# Patient Record
Sex: Female | Born: 1960 | Race: White | Hispanic: No | Marital: Married | Smoking: Current every day smoker
Health system: Southern US, Community
[De-identification: ages and names within clinical notes are randomized; demographics above are authoritative.]

## PROBLEM LIST (undated history)

## (undated) DIAGNOSIS — M1990 Unspecified osteoarthritis, unspecified site: Secondary | ICD-10-CM

## (undated) DIAGNOSIS — F1010 Alcohol abuse, uncomplicated: Secondary | ICD-10-CM

## (undated) DIAGNOSIS — F1520 Other stimulant dependence, uncomplicated: Secondary | ICD-10-CM

## (undated) DIAGNOSIS — F419 Anxiety disorder, unspecified: Secondary | ICD-10-CM

## (undated) DIAGNOSIS — J449 Chronic obstructive pulmonary disease, unspecified: Secondary | ICD-10-CM

## (undated) DIAGNOSIS — I719 Aortic aneurysm of unspecified site, without rupture: Secondary | ICD-10-CM

## (undated) DIAGNOSIS — K859 Acute pancreatitis without necrosis or infection, unspecified: Secondary | ICD-10-CM

## (undated) HISTORY — DX: Other stimulant dependence, uncomplicated: F15.20

## (undated) HISTORY — DX: Alcohol abuse, uncomplicated: F10.10

## (undated) HISTORY — PX: CHOLECYSTECTOMY: SHX55

## (undated) HISTORY — DX: Acute pancreatitis without necrosis or infection, unspecified: K85.90

## (undated) HISTORY — PX: TUBAL LIGATION: SHX77

## (undated) HISTORY — DX: Anxiety disorder, unspecified: F41.9

## (undated) HISTORY — DX: Chronic obstructive pulmonary disease, unspecified: J44.9

## (undated) HISTORY — DX: Unspecified osteoarthritis, unspecified site: M19.90

## (undated) HISTORY — PX: ERCP: SHX60

---

## 2000-10-27 ENCOUNTER — Inpatient Hospital Stay (HOSPITAL_COMMUNITY): Admission: EM | Admit: 2000-10-27 | Discharge: 2000-10-31 | Payer: Self-pay | Admitting: Psychiatry

## 2000-11-26 ENCOUNTER — Inpatient Hospital Stay (HOSPITAL_COMMUNITY): Admission: EM | Admit: 2000-11-26 | Discharge: 2000-12-04 | Payer: Self-pay | Admitting: Psychiatry

## 2005-08-16 ENCOUNTER — Inpatient Hospital Stay (HOSPITAL_COMMUNITY): Admission: AD | Admit: 2005-08-16 | Discharge: 2005-08-18 | Payer: Self-pay | Admitting: Psychiatry

## 2005-08-16 ENCOUNTER — Ambulatory Visit: Payer: Self-pay | Admitting: *Deleted

## 2006-06-17 ENCOUNTER — Ambulatory Visit (HOSPITAL_COMMUNITY): Admission: RE | Admit: 2006-06-17 | Discharge: 2006-06-17 | Payer: Self-pay | Admitting: Family Medicine

## 2009-11-26 ENCOUNTER — Emergency Department (HOSPITAL_COMMUNITY): Admission: EM | Admit: 2009-11-26 | Discharge: 2009-11-26 | Payer: Self-pay | Admitting: Emergency Medicine

## 2009-12-21 ENCOUNTER — Emergency Department (HOSPITAL_COMMUNITY)
Admission: EM | Admit: 2009-12-21 | Discharge: 2009-12-22 | Payer: Self-pay | Source: Home / Self Care | Admitting: Emergency Medicine

## 2009-12-22 ENCOUNTER — Inpatient Hospital Stay (HOSPITAL_COMMUNITY): Admission: EM | Admit: 2009-12-22 | Discharge: 2010-01-09 | Payer: Self-pay | Source: Home / Self Care

## 2010-01-09 ENCOUNTER — Emergency Department (HOSPITAL_COMMUNITY)
Admission: EM | Admit: 2010-01-09 | Discharge: 2010-01-09 | Payer: Self-pay | Source: Home / Self Care | Admitting: Emergency Medicine

## 2010-01-09 ENCOUNTER — Ambulatory Visit: Admit: 2010-01-09 | Payer: Self-pay | Admitting: Gastroenterology

## 2010-01-09 ENCOUNTER — Encounter (INDEPENDENT_AMBULATORY_CARE_PROVIDER_SITE_OTHER): Payer: Self-pay | Admitting: *Deleted

## 2010-01-13 DIAGNOSIS — K859 Acute pancreatitis without necrosis or infection, unspecified: Secondary | ICD-10-CM

## 2010-01-13 HISTORY — DX: Acute pancreatitis without necrosis or infection, unspecified: K85.90

## 2010-01-16 ENCOUNTER — Telehealth (INDEPENDENT_AMBULATORY_CARE_PROVIDER_SITE_OTHER): Payer: Self-pay

## 2010-01-30 ENCOUNTER — Ambulatory Visit
Admission: RE | Admit: 2010-01-30 | Discharge: 2010-01-30 | Payer: Self-pay | Source: Home / Self Care | Attending: Gastroenterology | Admitting: Gastroenterology

## 2010-01-30 DIAGNOSIS — Z8719 Personal history of other diseases of the digestive system: Secondary | ICD-10-CM | POA: Insufficient documentation

## 2010-01-30 DIAGNOSIS — F1021 Alcohol dependence, in remission: Secondary | ICD-10-CM | POA: Insufficient documentation

## 2010-02-04 ENCOUNTER — Telehealth (INDEPENDENT_AMBULATORY_CARE_PROVIDER_SITE_OTHER): Payer: Self-pay

## 2010-02-14 NOTE — Miscellaneous (Signed)
Summary: CONSULTATION  [name], [name]                  ACCOUNT NO.:  [id number]      MEDICAL RECORD NO.:  [id number]          PATIENT TYPE:  INP      LOCATION:  A306                          FACILITY:  [facility]      PHYSICIAN:  R. Roetta Sessions, M.D. DATE OF BIRTH:  07-20-1960      DATE OF CONSULTATION:  12/27/2009   DATE OF DISCHARGE:                                    CONSULTATION         REASON FOR CONSULTATION:  Pancreatitis.      GASTROENTEROLOGIST:  Dr. Jena Gauss.      HISTORY OF PRESENT ILLNESS:  [name] is pleasant 50 year old   Caucasian female who reports a history of back pain times one month.   Prior to admission, she noticed exacerbation of back pain as well as   epigastric pain.  For the few days leading up to the admission, she   reports that it was stabbing, 10/10.  It radiated straight through to   her back.  It was associated with nausea and vomiting.  She does have a   history of daily alcohol use since her father passed away in 2022/03/16.   She drinks several glasses a day, usually vodka mixed with some sort of   juice.  She also does have a remote history of benzodiazepine,   methamphetamine and alcohol abuse, however, she denies any use of these   medications currently.  She has had no diarrhea.  She has had no   hematemesis.  Currently, her pain is controlled with pain medication.   She does still complain of intermittent epigastric pain that radiates to   her back as well as radiates to the right upper quadrant.  She does have   chronic underlying nausea.  She denies any history of reflux.  She did   note one episode of bright red blood per rectum four months ago.  She   reports a possible history of hemorrhoids.  She did have a CT scan done   on admission, December 22, 2009, which showed extensive inflammatory   process in the upper abdomen centered at the pancreatic head and   duodenum.  There was infiltration of the surrounding tissue planes,   fluid  in the right anterior pararenal space extending to the small bowel   mesentery and transverse mesocolon.  It was favoring pancreatitis due to   ulcer disease but recommended correlation with serum amylase level. Upon   presentation to the hospital, her lipase was 964 and white count was   13.4.      PAST MEDICAL HISTORY:  Hemorrhoids, anxiety, alcohol abuse, history of   problems with benzodiazepines, methamphetamines.      PAST SURGICAL HISTORY:  Cholecystectomy by Dr. Malvin Johns in the late   1990s as well as a reported ERCP with Dr. Jena Gauss for a common bile duct   stone.  She had a tubal ligation in 1993.  She has had no prior   colonoscopy.      SOCIAL HISTORY:  She smokes one-and-one-half packs per day for 20 years.   She denies the use of any illicit drugs.  She does drink alcohol as   mentioned previously, several glasses per day.  Her choice is vodka with   juice.  She reports this since 2022-03-21 when her father passed away.   She is unemployed.  She takes care of her mom full-time.  At home she   does have three children; two of them live at home, one lives nearby.   She is married, but she is currently separated.      FAMILY HISTORY:  Her dad passed away in 03/21/22 of lung/bladder cancer.   Her mom is living with health problems and chronic arthritis, chronic   kidney disease, COPD and peripheral vascular disease.  Her sister was   diagnosed with breast cancer.  She has no family history of colon cancer   or liver problems.      ALLERGIES:  Darvocet, codeine and lisinopril.      CURRENT MEDICATIONS FOR THIS ADMISSION:  Albuterol, Unasyn, Cipro,   clonidine, Atrovent, Ativan, magnesium sulfate, nicotine patch,   Protonix, Benadryl, Dilaudid, Phenergan, Desyrel.      REVIEW OF SYSTEMS:  Review of systems is negative in detail except as   mentioned in the HPI.      PHYSICAL EXAMINATION:  VITAL SIGNS:  BP 105/70, pulse 80, respirations   18, temp 98.2.  She is 95% on 2  liters via nasal cannula.   GENERAL:  She is alert and oriented.  She is somewhat anxious and   concerned about current situation.  Her husband was also diagnosed with   colon cancer and ended up having to have a colectomy.   HEENT:  Normocephalic and atraumatic.  Sclerae without any icterus.   NECK:  Supple without any lymphadenopathy.   LUNGS:  Clear to auscultation bilaterally, no wheezes, rales or rhonchi.   CARDIAC:  Regular rate and rhythm, S1 and S2 noted, no murmurs, rubs or   gallops.   ABDOMEN:  Soft, positive bowel sounds, moderate epigastric tenderness,   no guarding.   EXTREMITIES:  Without edema.   SKIN:  Without jaundice or rash.   NEUROLOGIC:  She is alert and oriented, calm and cooperative, however,   as mentioned before, she is quite anxious.      PERTINENT LABS FOR THIS ADMISSION:  On December 22, 2009, admitting   lipase was 964.  It trended down to 37 in the normal range on December 24, 2009.  She then had another bump in her lipase this morning to 150.   Amylase is elevated at 240.  Sodium 136, potassium 3.2, BUN 4,   creatinine 0.47, total bilirubin 1.2, alk phos 69, AST 21 and ALT 25.   It should be noted on admission she did have slightly elevated LFTs with   a total bilirubin at 1.6, AST at 231 and ALT at 118.  CBC revealed white   count 11 and on admission was 13, H and H is stable at 13.6 and 37.5,   platelets 129,000.      RADIOLOGICAL DATA FOR THIS ADMISSION:  CT of abdomen and pelvis   mentioned in detail in the HPI favoring pancreatitis over ulcer disease,   diffuse fatty infiltration of the liver, probable left ovarian cyst and   tiny umbilical hernia containing fat.      ASSESSMENT:  [name] is a 50 year old Caucasian female who   presented  on December 22, 2009 with an acute episode of pancreatitis.   This is her first episode reportedly.  It is likely thought to be   related to alcohol use as, of note, she does have history of a common    bile duct stone requiring ERCP in the 1990s.  She is scheduled to have a   repeat CT scan today due to recent labs showing a bump in her lipase.      PLAN:   1. Continue IV resuscitation, remain NPO, pain control.  Obtain       records from ERCP in the late 1990s.  Review CT scan when it is       available.   2. Will follow blood work including lipase as well as LFTs.  Continue       PPI.  She will need an outpatient colonoscopy due to the evidence       of hematochezia a few months ago.  This is not an urgent procedure       but can       be done outpatient.  We will review the findings of the CT with Dr.       Jena Gauss after it is completed to decide further intervention and       appropriate nutrition support as indicated.      We would like to thank you for this referral of this nice lady.            ______________________________   Gerrit Halls, ANP-BC         ______________________________   R. Roetta Sessions, M.D.            AS/MEDQ  D:  12/27/2009  T:  12/27/2009  Job:  [id number]      Electronically Signed by Gerrit Halls  on 01/03/2010 03:58:50 PM   Electronically Signed by Lorrin Goodell M.D. on 01/05/2010 02:59:06 PM  Clinical Lists Changes NAME:  GENEVIENE, TESCH                  ACCOUNT NO.:  [id number]      MEDICAL RECORD NO.:  [id number]          PATIENT TYPE:  INP      LOCATION:  A306                          FACILITY:  [facility]      PHYSICIAN:  R. Roetta Sessions, M.D. DATE OF BIRTH:  [birth year]      DATE OF CONSULTATION:  12/27/2009   DATE OF DISCHARGE:                                    CONSULTATION         REASON FOR CONSULTATION:  Pancreatitis.      GASTROENTEROLOGIST:  Dr. Jena Gauss.      HISTORY OF PRESENT ILLNESS:  [name] is pleasant 50 year old   Caucasian female who reports a history of back pain times one month.   Prior to admission, she noticed exacerbation of back pain as well as   epigastric pain.  For the few days leading up to the admission, she   reports that  it was stabbing, 10/10.  It radiated straight through to   her back.  It was associated with nausea and vomiting.  She does have a   history of  daily alcohol use since her father passed away in 2022-03-10.   She drinks several glasses a day, usually vodka mixed with some sort of   juice.  She also does have a remote history of benzodiazepine,   methamphetamine and alcohol abuse, however, she denies any use of these   medications currently.  She has had no diarrhea.  She has had no   hematemesis.  Currently, her pain is controlled with pain medication.   She does still complain of intermittent epigastric pain that radiates to   her back as well as radiates to the right upper quadrant.  She does have   chronic underlying nausea.  She denies any history of reflux.  She did   note one episode of bright red blood per rectum four months ago.  She   reports a possible history of hemorrhoids.  She did have a CT scan done   on admission, December 22, 2009, which showed extensive inflammatory   process in the upper abdomen centered at the pancreatic head and   duodenum.  There was infiltration of the surrounding tissue planes,   fluid in the right anterior pararenal space extending to the small bowel   mesentery and transverse mesocolon.  It was favoring pancreatitis due to   ulcer disease but recommended correlation with serum amylase level. Upon   presentation to the hospital, her lipase was 964 and white count was   13.4.      PAST MEDICAL HISTORY:  Hemorrhoids, anxiety, alcohol abuse, history of   problems with benzodiazepines, methamphetamines.      PAST SURGICAL HISTORY:  Cholecystectomy by Dr. Malvin Johns in the late   1990s as well as a reported ERCP with Dr. Jena Gauss for a common bile duct   stone.  She had a tubal ligation in 1993.  She has had no prior   colonoscopy.      SOCIAL HISTORY:  She smokes one-and-one-half packs per day for 20 years.   She denies the use of any illicit drugs.  She  does drink alcohol as   mentioned previously, several glasses per day.  Her choice is vodka with   juice.  She reports this since March 10, 2022 when her father passed away.   She is unemployed.  She takes care of her mom full-time.  At home she   does have three children; two of them live at home, one lives nearby.   She is married, but she is currently separated.      FAMILY HISTORY:  Her dad passed away in 03/10/22 of lung/bladder cancer.   Her mom is living with health problems and chronic arthritis, chronic   kidney disease, COPD and peripheral vascular disease.  Her sister was   diagnosed with breast cancer.  She has no family history of colon cancer   or liver problems.      ALLERGIES:  Darvocet, codeine and lisinopril.      CURRENT MEDICATIONS FOR THIS ADMISSION:  Albuterol, Unasyn, Cipro,   clonidine, Atrovent, Ativan, magnesium sulfate, nicotine patch,   Protonix, Benadryl, Dilaudid, Phenergan, Desyrel.      REVIEW OF SYSTEMS:  Review of systems is negative in detail except as   mentioned in the HPI.      PHYSICAL EXAMINATION:  VITAL SIGNS:  BP 105/70, pulse 80, respirations   18, temp 98.2.  She is 95% on 2 liters via nasal cannula.   GENERAL:  She is alert and oriented.  She is somewhat anxious and  concerned about current situation.  Her husband was also diagnosed with   colon cancer and ended up having to have a colectomy.   HEENT:  Normocephalic and atraumatic.  Sclerae without any icterus.   NECK:  Supple without any lymphadenopathy.   LUNGS:  Clear to auscultation bilaterally, no wheezes, rales or rhonchi.   CARDIAC:  Regular rate and rhythm, S1 and S2 noted, no murmurs, rubs or   gallops.   ABDOMEN:  Soft, positive bowel sounds, moderate epigastric tenderness,   no guarding.   EXTREMITIES:  Without edema.   SKIN:  Without jaundice or rash.   NEUROLOGIC:  She is alert and oriented, calm and cooperative, however,   as mentioned before, she is quite anxious.       PERTINENT LABS FOR THIS ADMISSION:  On December 22, 2009, admitting   lipase was 964.  It trended down to 37 in the normal range on December 24, 2009.  She then had another bump in her lipase this morning to 150.   Amylase is elevated at 240.  Sodium 136, potassium 3.2, BUN 4,   creatinine 0.47, total bilirubin 1.2, alk phos 69, AST 21 and ALT 25.   It should be noted on admission she did have slightly elevated LFTs with   a total bilirubin at 1.6, AST at 231 and ALT at 118.  CBC revealed white   count 11 and on admission was 13, H and H is stable at 13.6 and 37.5,   platelets 129,000.      RADIOLOGICAL DATA FOR THIS ADMISSION:  CT of abdomen and pelvis   mentioned in detail in the HPI favoring pancreatitis over ulcer disease,   diffuse fatty infiltration of the liver, probable left ovarian cyst and   tiny umbilical hernia containing fat.      ASSESSMENT:  [name] is a 50 year old Caucasian female who   presented on December 22, 2009 with an acute episode of pancreatitis.   This is her first episode reportedly.  It is likely thought to be   related to alcohol use as, of note, she does have history of a common   bile duct stone requiring ERCP in the 1990s.  She is scheduled to have a   repeat CT scan today due to recent labs showing a bump in her lipase.      PLAN:   1. Continue IV resuscitation, remain NPO, pain control.  Obtain       records from ERCP in the late 1990s.  Review CT scan when it is       available.   2. Will follow blood work including lipase as well as LFTs.  Continue       PPI.  She will need an outpatient colonoscopy due to the evidence       of hematochezia a few months ago.  This is not an urgent procedure       but can       be done outpatient.  We will review the findings of the CT with Dr.       Jena Gauss after it is completed to decide further intervention and       appropriate nutrition support as indicated.      We would like to thank you for this  referral of this nice lady.            ______________________________   Gerrit Halls, ANP-BC         ______________________________  Jonathon Bellows, M.D.            AS/MEDQ  D:  12/27/2009  T:  12/27/2009  Job:  [id number]      Electronically Signed by Gerrit Halls  on 01/03/2010 03:58:50 PM   Electronically Signed by Lorrin Goodell M.D. on 01/05/2010 02:59:06 PM

## 2010-02-14 NOTE — Progress Notes (Signed)
----   Converted from flag ---- ---- 01/31/2010 1:41 PM, Gerrit Halls NP wrote: Can we call this nice lady and make sure she is taking a multivitamin. Also, make sure taking thiamine, folic acid. all over the counter. ------------------------------  Appended Document:  Pt says she is taking the multi-vitamin daily. She will begin to take thiamine and folic acid.

## 2010-02-14 NOTE — Assessment & Plan Note (Signed)
Summary: HOSP F/U PANCREATITIS/LAW   Visit Type:  Follow-up Visit Referring Provider:  Hospital F/U Primary Care Provider:  Dr. Sherryll Burger  CC:  F/U pancreatitis.  History of Present Illness: [name] presents as hospital f/u; was in [facility] from 12/10-12/18 due to severe pancreatitis, ETOH-related. Developed pseudocysts while admitted, required TPN. Presents today still somewhat weak, but feeling significantly better. Takes 1/2 phenergan in am and pm. Only has small amount of mid back pain if sitting up for a long time. No abdominal/epigastric pain. Denies ETOH use since being discharged. Going to AA meeting twice/week. Tolerating diet, following low-fat diet. BM daily, no diarrhea, no melena or brbpr. During admission, had reported an incidence of brbpr 4 mos prior. No prior colonoscopy.      Current Medications (verified): 1)  Promethazine Hcl 12.5 Mg Tabs (Promethazine Hcl) .Marland Kitchen.. 12.5 Mg By Mouth Every 6 Hours As Needed Nausea 2)  Vicodin 5-500 Mg Tabs (Hydrocodone-Acetaminophen) .Marland Kitchen.. 1-2 By Mouth Every 6 Hours As Needed Pain.  Allergies (verified): 1)  ! Lisinopril 2)  ! Darvocet 3)  ! Percocet  Past History:  Past Medical History: Hx hemorrhoids anxiety ETOH abuse hx of BSD, methamphetamine dependence  Past Surgical History: Cholecystectomy 1990s ?ERCP with Dr. Jena Gauss in past for CBD stone? Tubal ligation  Family History: Father: Deceased 2009/03/26: lung/bladder ca Mom: living, arthritis, kidney disease, COPD, PVD sister: breast ca No FH of Colon Cancer:  Social History: Patient currently smokes. 1.5 ppd X 20 years hx ETOH abuse, several glasses of vodka daily as recent as early December: as of 01/31/10 states has not drank since hospital d/c 01/09/10 Unemployed three children, 2 live at home Married, but currently separated Smoking Status:  current  Review of Systems General:  Denies fever, chills, and anorexia. Eyes:  Denies blurring, irritation, and discharge. ENT:   Denies sore throat, hoarseness, and difficulty swallowing. CV:  Denies chest pains and syncope. Resp:  Denies dyspnea at rest and wheezing. GI:  Denies difficulty swallowing, pain on swallowing, nausea, indigestion/heartburn, abdominal pain, constipation, change in bowel habits, bloody BM's, and black BMs. GU:  Denies urinary burning and urinary frequency. MS:  Denies joint pain / LOM, joint swelling, and joint stiffness. Derm:  Denies rash, itching, and dry skin. Neuro:  Denies weakness and syncope. Psych:  Denies depression and anxiety. Endo:  Denies cold intolerance and heat intolerance.  Vital Signs:  Patient profile:   50 year old female Height:      66 inches Weight:      146 pounds BMI:     23.65 Temp:     98.7 degrees F oral Pulse rate:   92 / minute BP sitting:   160 / 100  (left arm) Cuff size:   large  Vitals Entered By: [name] (January 30, 2010 2:23 PM)  Physical Exam  General:  Well developed, well nourished, no acute distress. Mouth:  No deformity or lesions, dentition normal. Lungs:  Clear throughout to auscultation. Heart:  Regular rate and rhythm; no murmurs, rubs,  or bruits. Abdomen:  soft, +BS, non-tender, non-distended, no rebound or guarding, no HSM noted.  Msk:  Symmetrical with no gross deformities. Normal posture. Neurologic:  Alert and  oriented x4;  grossly normal neurologically. Psych:  Alert and cooperative. Normal mood and affect.  Impression & Recommendations:  Problem # 1:  PANCREATITIS, HX OF (ICD-V55.19)  50 year old pleasant Caucasian female with hx of prolonged hospital course in Dec 2011 secondary to severe pancreatitis, ETOH-related. Required supplemental  nutrition via TPN. Progressing well, no abdominal pain, only mild mid-back pain if sitting for too long. No N/V.   Continue low-fat diet Refill of vicodin and phenergan, limited supply re-evaluate in 4-6 weeks Will need colonoscopy in near future secondary to incidence of  brbpr in remote past. Pt informed; this can be addressed at next OV and set up.   Orders: Est. Patient Level II (45409) Est. Patient Level II (81191)  Problem # 2:  ALCOHOL ABUSE, HX OF (ICD-V21.74)  50 year old female with hx of alcohol abuse, currently states is abstinent. Attending AA meetings. Applauded pt on efforts. Will add supplemental vitamins to regimen.  MVI daily, thiamine, folic acid  Orders: Est. Patient Level II (47829) Prescriptions: VICODIN 5-500 MG TABS (HYDROCODONE-ACETAMINOPHEN) take 1-2 by mouth every 6 hours as needed pain  #30 x 0   Entered and Authorized by:   Gerrit Halls NP   Signed by:   Gerrit Halls NP on 01/30/2010   Method used:   Printed then faxed to ...       [facility] (retail)       [address]       [place], [place]  [address]       Ph: [phone]       Fax: [phone]   RxID:   [id number] PROMETHAZINE HCL 12.5 MG TABS (PROMETHAZINE HCL) 12.5 mg by mouth every 6 hours as needed nausea  #30 x 0   Entered and Authorized by:   Gerrit Halls NP   Signed by:   Gerrit Halls NP on 01/30/2010   Method used:   Faxed to ...       [facility] (retail)       [address]       [place], [place]  [address]       Ph: [phone]       Fax: [phone]   RxID:   [id number]   Appended Document: HOSP F/U PANCREATITIS/LAW F/U OPV IN 4-6 WKS IS IN THE COMP

## 2010-02-14 NOTE — Progress Notes (Signed)
Summary: vomiting  Phone Note Call from Patient Call back at [phone]   Caller: Patient Summary of Call: pt called- left voicemail- stated she was vomiting and is out of her vomiting medication. wants to know if it is normal for her to be vomiting with her condition and needs a refill. please advise Initial call taken by: Hendricks Limes LPN,  January 16, 2010 2:00 PM     Appended Document: vomiting Will refill phenergan. Pt states percocet is making her itch. will switch to vicodin. please inform pt to not take both. if vomiting worsens, continues, increased pain, call office. needs to follow low fat diet, slowly advance as tolerated.   Appended Document: vomiting    Prescriptions: VICODIN 5-500 MG TABS (HYDROCODONE-ACETAMINOPHEN) 1-2 by mouth every 6 hours as needed pain.  #30 x 0   Entered and Authorized by:   Gerrit Halls NP   Signed by:   Gerrit Halls NP on 01/16/2010   Method used:   Printed then faxed to ...       [facility] (retail)       [address]       [place], [place]  [address]       Ph: [phone]       Fax: [phone]   RxID:   [id number] PROMETHAZINE HCL 12.5 MG TABS (PROMETHAZINE HCL) 12.5 mg by mouth every 6 hours as needed nausea  #30 x 0   Entered and Authorized by:   Gerrit Halls NP   Signed by:   Gerrit Halls NP on 01/16/2010   Method used:   Faxed to ...       [facility] (retail)       [address]       [place], [place]  [address]       Ph: [phone]       Fax: [phone]   RxID:   [id number]     Appended Document: vomiting please inform pt that vicodin has tylenol in it. each tablet has 500 mg. avoid taking regular tylenol along with it. i saw she was d/c home with oxycodone and tylenol.   Appended Document: vomiting pt aware

## 2010-02-28 ENCOUNTER — Encounter: Payer: Self-pay | Admitting: Gastroenterology

## 2010-02-28 ENCOUNTER — Ambulatory Visit (INDEPENDENT_AMBULATORY_CARE_PROVIDER_SITE_OTHER): Payer: Managed Care, Other (non HMO) | Admitting: Gastroenterology

## 2010-02-28 DIAGNOSIS — Z8719 Personal history of other diseases of the digestive system: Secondary | ICD-10-CM

## 2010-03-08 ENCOUNTER — Encounter (INDEPENDENT_AMBULATORY_CARE_PROVIDER_SITE_OTHER): Payer: Self-pay | Admitting: *Deleted

## 2010-03-12 NOTE — Medication Information (Signed)
Summary: VICODIN 5-500 MG  VICODIN 5-500 MG   Imported By: Rexene Alberts 03/08/2010 08:51:40  _____________________________________________________________________  External Attachment:    Type:   Image     Comment:   External Document  Appended Document: VICODIN 5-500 MG    Prescriptions: VICODIN 5-500 MG TABS (HYDROCODONE-ACETAMINOPHEN) take 1-2 by mouth every 6 hours as needed pain  #30 x 0   Entered and Authorized by:   Joselyn Arrow FNP-BC   Signed by:   Joselyn Arrow FNP-BC on 03/08/2010   Method used:   Print then Give to Patient   RxID:   [id number]     Appended Document: VICODIN 5-500 MG rx called to [facility]

## 2010-03-12 NOTE — Assessment & Plan Note (Signed)
Summary: FU OV IN ONE MONTH/PANCREATITIS/SS   Vital Signs:  Patient profile:   50 year old female Height:      66 inches Weight:      147 pounds BMI:     23.81 Temp:     98.4 degrees F oral Pulse rate:   72 / minute BP sitting:   140 / 92  (left arm) Cuff size:   large  Vitals Entered By: [name] (February 28, 2010 2:39 PM)  Visit Type:  Follow-up Visit Primary Care Provider:  Dr. Sherryll Burger  CC:  F/U .  History of Present Illness: Pt here in f/u after prolonged hospitalization secondary to pancreatitis, r/t ETOH. Denies abdominal pain, did report a few episodes of gagging when brushing teeth. One isolated incidence last month with 1 episode of vomiting. No ETOH. Denies reflux, indigestion. Denies loose, runny BMs. No blood in stool. No lack of appetite. Did report one episode of small amt of brbpr several months prior when evaluated inpatient during bout of pancreatitis;has hx of external hemorrhoids. No further evidence of any rectal bleeding. No change in bowel habits.   Current Medications (verified): 1)  Promethazine Hcl 12.5 Mg Tabs (Promethazine Hcl) .Marland Kitchen.. 12.5 Mg By Mouth Every 6 Hours As Needed Nausea 2)  Vicodin 5-500 Mg Tabs (Hydrocodone-Acetaminophen) .... Take 1-2 By Mouth Every 6 Hours As Needed Pain 3)  Muti-Vitamin .... One Tablet Daily 4)  Thiamine .... One Tablet Daily 5)  Folic Acid .... One Tablet Daily  Allergies (verified): 1)  ! Lisinopril 2)  ! Darvocet 3)  ! Percocet  Past History:  Past Medical History: Last updated: 01/30/2010 Hx hemorrhoids anxiety ETOH abuse hx of BSD, methamphetamine dependence  Review of Systems General:  Denies fever, chills, and anorexia. Eyes:  Denies blurring, irritation, and discharge. ENT:  Denies sore throat, hoarseness, and difficulty swallowing. CV:  Denies chest pains and syncope. Resp:  Denies dyspnea at rest and wheezing. GI:  See HPI. GU:  Denies urinary burning and urinary frequency. MS:  Denies  joint pain / LOM, joint swelling, and joint stiffness. Derm:  Denies rash, itching, and dry skin. Neuro:  Denies weakness and syncope. Psych:  Denies depression and anxiety.  Physical Exam  General:  Well developed, well nourished, no acute distress. Head:  Normocephalic and atraumatic. Eyes:  PERRLA, no icterus. Lungs:  Clear throughout to auscultation. Heart:  Regular rate and rhythm; no murmurs, rubs,  or bruits. Abdomen:  +BS, soft, non-tender, non-distended. no HSM. No rebound or guarding. No mass noted.  Msk:  Symmetrical with no gross deformities. Normal posture. Neurologic:  Alert and  oriented x4;  grossly normal neurologically. Psych:  Alert and cooperative. Normal mood and affect.   Impression & Recommendations:  Problem # 1:  PANCREATITIS, HX OF (ICD-V61.36)  50 year old with hx of severe pancreatitis requiring prolonged admission, here for f/u. Doing well, tolerating diet. No abdominal pain, n/v. Does have small amount of gagging when brushing teeth, but otherwise ok. BMs normal, no melena/hematochezia. Does have hx of small amt of brbpr in remote past. No further incidences. No prior colonoscopy, does have hx of hemorrhoids described as extensive.   Continue to avoid ETOh No TCS needed at this time; if any further incidences of brbpr, contact office and will proceed with TCS. F/U as needed   Orders: Est. Patient Level II (81191)   Orders Added: 1)  Est. Patient Level II [47829]

## 2010-03-25 LAB — CBC
HCT: 37.2 % (ref 36.0–46.0)
HCT: 38 % (ref 36.0–46.0)
HCT: 38.6 % (ref 36.0–46.0)
HCT: 39 % (ref 36.0–46.0)
HCT: 39.4 % (ref 36.0–46.0)
HCT: 39.8 % (ref 36.0–46.0)
HCT: 40.3 % (ref 36.0–46.0)
HCT: 43.5 % (ref 36.0–46.0)
Hemoglobin: 13.1 g/dL (ref 12.0–15.0)
Hemoglobin: 13.2 g/dL (ref 12.0–15.0)
Hemoglobin: 13.4 g/dL (ref 12.0–15.0)
Hemoglobin: 13.5 g/dL (ref 12.0–15.0)
Hemoglobin: 14.2 g/dL (ref 12.0–15.0)
MCH: 32.4 pg (ref 26.0–34.0)
MCH: 32.5 pg (ref 26.0–34.0)
MCH: 32.9 pg (ref 26.0–34.0)
MCH: 33.1 pg (ref 26.0–34.0)
MCH: 33.1 pg (ref 26.0–34.0)
MCH: 33.1 pg (ref 26.0–34.0)
MCH: 33.2 pg (ref 26.0–34.0)
MCH: 33.2 pg (ref 26.0–34.0)
MCH: 33.7 pg (ref 26.0–34.0)
MCH: 33.8 pg (ref 26.0–34.0)
MCHC: 33.9 g/dL (ref 30.0–36.0)
MCHC: 34.2 g/dL (ref 30.0–36.0)
MCHC: 34.6 g/dL (ref 30.0–36.0)
MCHC: 34.9 g/dL (ref 30.0–36.0)
MCHC: 35.2 g/dL (ref 30.0–36.0)
MCHC: 35.3 g/dL (ref 30.0–36.0)
MCHC: 35.5 g/dL (ref 30.0–36.0)
MCHC: 36 g/dL (ref 30.0–36.0)
MCV: 93.1 fL (ref 78.0–100.0)
MCV: 93.1 fL (ref 78.0–100.0)
MCV: 93.5 fL (ref 78.0–100.0)
MCV: 93.8 fL (ref 78.0–100.0)
MCV: 94.4 fL (ref 78.0–100.0)
MCV: 94.4 fL (ref 78.0–100.0)
MCV: 94.4 fL (ref 78.0–100.0)
MCV: 95.2 fL (ref 78.0–100.0)
MCV: 95.2 fL (ref 78.0–100.0)
MCV: 95.5 fL (ref 78.0–100.0)
MCV: 96 fL (ref 78.0–100.0)
MCV: 96.2 fL (ref 78.0–100.0)
Platelets: 129 10*3/uL — ABNORMAL LOW (ref 150–400)
Platelets: 271 10*3/uL (ref 150–400)
Platelets: 308 10*3/uL (ref 150–400)
Platelets: 309 10*3/uL (ref 150–400)
Platelets: 350 10*3/uL (ref 150–400)
Platelets: 351 10*3/uL (ref 150–400)
Platelets: 355 10*3/uL (ref 150–400)
Platelets: 371 10*3/uL (ref 150–400)
RBC: 4.04 MIL/uL (ref 3.87–5.11)
RBC: 4.14 MIL/uL (ref 3.87–5.11)
RBC: 4.16 MIL/uL (ref 3.87–5.11)
RBC: 4.34 MIL/uL (ref 3.87–5.11)
RDW: 12.5 % (ref 11.5–15.5)
RDW: 12.7 % (ref 11.5–15.5)
RDW: 12.8 % (ref 11.5–15.5)
RDW: 12.9 % (ref 11.5–15.5)
RDW: 13.1 % (ref 11.5–15.5)
RDW: 13.2 % (ref 11.5–15.5)
RDW: 13.3 % (ref 11.5–15.5)
RDW: 13.3 % (ref 11.5–15.5)
RDW: 13.4 % (ref 11.5–15.5)
RDW: 13.4 % (ref 11.5–15.5)
RDW: 13.5 % (ref 11.5–15.5)
WBC: 11 10*3/uL — ABNORMAL HIGH (ref 4.0–10.5)
WBC: 11.1 10*3/uL — ABNORMAL HIGH (ref 4.0–10.5)
WBC: 11.4 10*3/uL — ABNORMAL HIGH (ref 4.0–10.5)
WBC: 8.2 10*3/uL (ref 4.0–10.5)
WBC: 8.6 10*3/uL (ref 4.0–10.5)
WBC: 9.1 10*3/uL (ref 4.0–10.5)
WBC: 9.6 10*3/uL (ref 4.0–10.5)

## 2010-03-25 LAB — GLUCOSE, CAPILLARY
Glucose-Capillary: 100 mg/dL — ABNORMAL HIGH (ref 70–99)
Glucose-Capillary: 101 mg/dL — ABNORMAL HIGH (ref 70–99)
Glucose-Capillary: 102 mg/dL — ABNORMAL HIGH (ref 70–99)
Glucose-Capillary: 105 mg/dL — ABNORMAL HIGH (ref 70–99)
Glucose-Capillary: 106 mg/dL — ABNORMAL HIGH (ref 70–99)
Glucose-Capillary: 111 mg/dL — ABNORMAL HIGH (ref 70–99)
Glucose-Capillary: 111 mg/dL — ABNORMAL HIGH (ref 70–99)
Glucose-Capillary: 112 mg/dL — ABNORMAL HIGH (ref 70–99)
Glucose-Capillary: 113 mg/dL — ABNORMAL HIGH (ref 70–99)
Glucose-Capillary: 113 mg/dL — ABNORMAL HIGH (ref 70–99)
Glucose-Capillary: 115 mg/dL — ABNORMAL HIGH (ref 70–99)
Glucose-Capillary: 115 mg/dL — ABNORMAL HIGH (ref 70–99)
Glucose-Capillary: 116 mg/dL — ABNORMAL HIGH (ref 70–99)
Glucose-Capillary: 117 mg/dL — ABNORMAL HIGH (ref 70–99)
Glucose-Capillary: 120 mg/dL — ABNORMAL HIGH (ref 70–99)
Glucose-Capillary: 122 mg/dL — ABNORMAL HIGH (ref 70–99)
Glucose-Capillary: 123 mg/dL — ABNORMAL HIGH (ref 70–99)
Glucose-Capillary: 123 mg/dL — ABNORMAL HIGH (ref 70–99)
Glucose-Capillary: 142 mg/dL — ABNORMAL HIGH (ref 70–99)
Glucose-Capillary: 146 mg/dL — ABNORMAL HIGH (ref 70–99)
Glucose-Capillary: 151 mg/dL — ABNORMAL HIGH (ref 70–99)
Glucose-Capillary: 156 mg/dL — ABNORMAL HIGH (ref 70–99)
Glucose-Capillary: 79 mg/dL (ref 70–99)
Glucose-Capillary: 83 mg/dL (ref 70–99)
Glucose-Capillary: 84 mg/dL (ref 70–99)
Glucose-Capillary: 90 mg/dL (ref 70–99)
Glucose-Capillary: 93 mg/dL (ref 70–99)
Glucose-Capillary: 99 mg/dL (ref 70–99)

## 2010-03-25 LAB — COMPREHENSIVE METABOLIC PANEL
ALT: 18 U/L (ref 0–35)
ALT: 26 U/L (ref 0–35)
AST: 21 U/L (ref 0–37)
AST: 22 U/L (ref 0–37)
AST: 22 U/L (ref 0–37)
Albumin: 2.3 g/dL — ABNORMAL LOW (ref 3.5–5.2)
Albumin: 2.3 g/dL — ABNORMAL LOW (ref 3.5–5.2)
Albumin: 2.5 g/dL — ABNORMAL LOW (ref 3.5–5.2)
Albumin: 2.6 g/dL — ABNORMAL LOW (ref 3.5–5.2)
Alkaline Phosphatase: 104 U/L (ref 39–117)
Alkaline Phosphatase: 87 U/L (ref 39–117)
BUN: 1 mg/dL — ABNORMAL LOW (ref 6–23)
BUN: 2 mg/dL — ABNORMAL LOW (ref 6–23)
BUN: 5 mg/dL — ABNORMAL LOW (ref 6–23)
BUN: 6 mg/dL (ref 6–23)
BUN: 9 mg/dL (ref 6–23)
BUN: 9 mg/dL (ref 6–23)
CO2: 25 mEq/L (ref 19–32)
CO2: 26 mEq/L (ref 19–32)
Calcium: 8 mg/dL — ABNORMAL LOW (ref 8.4–10.5)
Calcium: 8.1 mg/dL — ABNORMAL LOW (ref 8.4–10.5)
Calcium: 8.4 mg/dL (ref 8.4–10.5)
Calcium: 8.7 mg/dL (ref 8.4–10.5)
Chloride: 102 mEq/L (ref 96–112)
Chloride: 104 mEq/L (ref 96–112)
Chloride: 106 mEq/L (ref 96–112)
Creatinine, Ser: 0.38 mg/dL — ABNORMAL LOW (ref 0.4–1.2)
Creatinine, Ser: 0.41 mg/dL (ref 0.4–1.2)
Creatinine, Ser: 0.47 mg/dL (ref 0.4–1.2)
Creatinine, Ser: 0.47 mg/dL (ref 0.4–1.2)
Creatinine, Ser: 0.49 mg/dL (ref 0.4–1.2)
GFR calc Af Amer: >60 mL/min (ref >60)
GFR calc Af Amer: >60 mL/min (ref >60)
GFR calc Af Amer: >60 mL/min (ref >60)
GFR calc non Af Amer: >60 mL/min (ref >60)
GFR calc non Af Amer: >60 mL/min (ref >60)
GFR calc non Af Amer: >60 mL/min (ref >60)
Glucose, Bld: 109 mg/dL — ABNORMAL HIGH (ref 70–99)
Glucose, Bld: 70 mg/dL (ref 70–99)
Glucose, Bld: 96 mg/dL (ref 70–99)
Glucose, Bld: 98 mg/dL (ref 70–99)
Potassium: 3.7 mEq/L (ref 3.5–5.1)
Potassium: 3.7 mEq/L (ref 3.5–5.1)
Sodium: 138 mEq/L (ref 135–145)
Total Bilirubin: 0.6 mg/dL (ref 0.3–1.2)
Total Bilirubin: 0.7 mg/dL (ref 0.3–1.2)
Total Bilirubin: 0.9 mg/dL (ref 0.3–1.2)
Total Bilirubin: 1 mg/dL (ref 0.3–1.2)
Total Bilirubin: 1.1 mg/dL (ref 0.3–1.2)
Total Bilirubin: 1.2 mg/dL (ref 0.3–1.2)
Total Protein: 4.8 g/dL — ABNORMAL LOW (ref 6.0–8.3)
Total Protein: 4.9 g/dL — ABNORMAL LOW (ref 6.0–8.3)
Total Protein: 5.7 g/dL — ABNORMAL LOW (ref 6.0–8.3)
Total Protein: 5.9 g/dL — ABNORMAL LOW (ref 6.0–8.3)
Total Protein: 7 g/dL (ref 6.0–8.3)

## 2010-03-25 LAB — BASIC METABOLIC PANEL
BUN: 1 mg/dL — ABNORMAL LOW (ref 6–23)
BUN: 10 mg/dL (ref 6–23)
BUN: 4 mg/dL — ABNORMAL LOW (ref 6–23)
BUN: 9 mg/dL (ref 6–23)
CO2: 26 mEq/L (ref 19–32)
Calcium: 8.7 mg/dL (ref 8.4–10.5)
Calcium: 8.8 mg/dL (ref 8.4–10.5)
Chloride: 104 mEq/L (ref 96–112)
Chloride: 105 mEq/L (ref 96–112)
Chloride: 106 mEq/L (ref 96–112)
Chloride: 99 mEq/L (ref 96–112)
Creatinine, Ser: 0.46 mg/dL (ref 0.4–1.2)
Creatinine, Ser: 0.48 mg/dL (ref 0.4–1.2)
Creatinine, Ser: 0.52 mg/dL (ref 0.4–1.2)
GFR calc non Af Amer: >60 mL/min (ref >60)
GFR calc non Af Amer: >60 mL/min (ref >60)
GFR calc non Af Amer: >60 mL/min (ref >60)
GFR calc non Af Amer: >60 mL/min (ref >60)
Glucose, Bld: 102 mg/dL — ABNORMAL HIGH (ref 70–99)
Glucose, Bld: 104 mg/dL — ABNORMAL HIGH (ref 70–99)
Glucose, Bld: 117 mg/dL — ABNORMAL HIGH (ref 70–99)
Glucose, Bld: 77 mg/dL (ref 70–99)
Glucose, Bld: 95 mg/dL (ref 70–99)
Potassium: 3.7 mEq/L (ref 3.5–5.1)
Potassium: 3.9 mEq/L (ref 3.5–5.1)
Potassium: 4.1 mEq/L (ref 3.5–5.1)
Potassium: 4.8 mEq/L (ref 3.5–5.1)
Sodium: 134 mEq/L — ABNORMAL LOW (ref 135–145)
Sodium: 138 mEq/L (ref 135–145)

## 2010-03-25 LAB — DIFFERENTIAL
Basophils Absolute: 0.1 10*3/uL (ref 0.0–0.1)
Basophils Absolute: 0.1 10*3/uL (ref 0.0–0.1)
Basophils Absolute: 0.1 10*3/uL (ref 0.0–0.1)
Basophils Absolute: 0.1 10*3/uL (ref 0.0–0.1)
Basophils Absolute: 0.1 10*3/uL (ref 0.0–0.1)
Basophils Absolute: 0.1 10*3/uL (ref 0.0–0.1)
Basophils Absolute: 0.1 10*3/uL (ref 0.0–0.1)
Basophils Absolute: 0.1 10*3/uL (ref 0.0–0.1)
Basophils Absolute: 0.2 10*3/uL — ABNORMAL HIGH (ref 0.0–0.1)
Basophils Relative: 1 % (ref 0–1)
Basophils Relative: 1 % (ref 0–1)
Basophils Relative: 1 % (ref 0–1)
Basophils Relative: 1 % (ref 0–1)
Basophils Relative: 1 % (ref 0–1)
Basophils Relative: 1 % (ref 0–1)
Eosinophils Absolute: 0.2 10*3/uL (ref 0.0–0.7)
Eosinophils Absolute: 0.2 10*3/uL (ref 0.0–0.7)
Eosinophils Absolute: 0.2 10*3/uL (ref 0.0–0.7)
Eosinophils Absolute: 0.2 10*3/uL (ref 0.0–0.7)
Eosinophils Absolute: 0.2 10*3/uL (ref 0.0–0.7)
Eosinophils Absolute: 0.2 10*3/uL (ref 0.0–0.7)
Eosinophils Absolute: 0.2 10*3/uL (ref 0.0–0.7)
Eosinophils Relative: 1 % (ref 0–5)
Eosinophils Relative: 1 % (ref 0–5)
Eosinophils Relative: 1 % (ref 0–5)
Eosinophils Relative: 1 % (ref 0–5)
Eosinophils Relative: 1 % (ref 0–5)
Eosinophils Relative: 2 % (ref 0–5)
Eosinophils Relative: 3 % (ref 0–5)
Lymphocytes Relative: 12 % (ref 12–46)
Lymphocytes Relative: 12 % (ref 12–46)
Lymphocytes Relative: 13 % (ref 12–46)
Lymphocytes Relative: 15 % (ref 12–46)
Lymphocytes Relative: 15 % (ref 12–46)
Lymphocytes Relative: 26 % (ref 12–46)
Lymphocytes Relative: 27 % (ref 12–46)
Lymphocytes Relative: 29 % (ref 12–46)
Lymphocytes Relative: 31 % (ref 12–46)
Lymphocytes Relative: 34 % (ref 12–46)
Lymphs Abs: 1.3 10*3/uL (ref 0.7–4.0)
Lymphs Abs: 1.7 10*3/uL (ref 0.7–4.0)
Lymphs Abs: 2 10*3/uL (ref 0.7–4.0)
Lymphs Abs: 2.1 10*3/uL (ref 0.7–4.0)
Lymphs Abs: 2.2 10*3/uL (ref 0.7–4.0)
Lymphs Abs: 2.7 10*3/uL (ref 0.7–4.0)
Lymphs Abs: 2.8 10*3/uL (ref 0.7–4.0)
Lymphs Abs: 3.1 10*3/uL (ref 0.7–4.0)
Monocytes Absolute: 0.8 10*3/uL (ref 0.1–1.0)
Monocytes Absolute: 0.9 10*3/uL (ref 0.1–1.0)
Monocytes Absolute: 1.4 10*3/uL — ABNORMAL HIGH (ref 0.1–1.0)
Monocytes Absolute: 2.1 10*3/uL — ABNORMAL HIGH (ref 0.1–1.0)
Monocytes Absolute: 2.1 10*3/uL — ABNORMAL HIGH (ref 0.1–1.0)
Monocytes Absolute: 2.1 10*3/uL — ABNORMAL HIGH (ref 0.1–1.0)
Monocytes Relative: 10 % (ref 3–12)
Monocytes Relative: 10 % (ref 3–12)
Monocytes Relative: 11 % (ref 3–12)
Monocytes Relative: 12 % (ref 3–12)
Monocytes Relative: 14 % — ABNORMAL HIGH (ref 3–12)
Monocytes Relative: 18 % — ABNORMAL HIGH (ref 3–12)
Monocytes Relative: 19 % — ABNORMAL HIGH (ref 3–12)
Monocytes Relative: 7 % (ref 3–12)
Monocytes Relative: 9 % (ref 3–12)
Monocytes Relative: 9 % (ref 3–12)
Monocytes Relative: 9 % (ref 3–12)
Neutro Abs: 10.3 10*3/uL — ABNORMAL HIGH (ref 1.7–7.7)
Neutro Abs: 11.2 10*3/uL — ABNORMAL HIGH (ref 1.7–7.7)
Neutro Abs: 4.6 10*3/uL (ref 1.7–7.7)
Neutro Abs: 4.9 10*3/uL (ref 1.7–7.7)
Neutro Abs: 6.8 10*3/uL (ref 1.7–7.7)
Neutro Abs: 7 10*3/uL (ref 1.7–7.7)
Neutro Abs: 7.4 10*3/uL (ref 1.7–7.7)
Neutro Abs: 7.6 10*3/uL (ref 1.7–7.7)
Neutro Abs: 9.8 10*3/uL — ABNORMAL HIGH (ref 1.7–7.7)
Neutrophils Relative %: 53 % (ref 43–77)
Neutrophils Relative %: 58 % (ref 43–77)
Neutrophils Relative %: 60 % (ref 43–77)
Neutrophils Relative %: 63 % (ref 43–77)
Neutrophils Relative %: 63 % (ref 43–77)
Neutrophils Relative %: 73 % (ref 43–77)
Neutrophils Relative %: 73 % (ref 43–77)
Neutrophils Relative %: 74 % (ref 43–77)
Neutrophils Relative %: 74 % (ref 43–77)

## 2010-03-25 LAB — MAGNESIUM
Magnesium: 1.6 mg/dL (ref 1.5–2.5)
Magnesium: 1.7 mg/dL (ref 1.5–2.5)
Magnesium: 1.8 mg/dL (ref 1.5–2.5)
Magnesium: 1.8 mg/dL (ref 1.5–2.5)
Magnesium: 1.8 mg/dL (ref 1.5–2.5)
Magnesium: 2 mg/dL (ref 1.5–2.5)

## 2010-03-25 LAB — BRAIN NATRIURETIC PEPTIDE
Pro B Natriuretic peptide (BNP): 41.5 pg/mL (ref 0.0–100.0)
Pro B Natriuretic peptide (BNP): <30 pg/mL (ref 0.0–100.0)

## 2010-03-25 LAB — LIPASE, BLOOD
Lipase: 100 U/L — ABNORMAL HIGH (ref 11–59)
Lipase: 33 U/L (ref 11–59)
Lipase: 68 U/L — ABNORMAL HIGH (ref 11–59)
Lipase: 68 U/L — ABNORMAL HIGH (ref 11–59)
Lipase: 82 U/L — ABNORMAL HIGH (ref 11–59)

## 2010-03-25 LAB — PREALBUMIN
Prealbumin: 14 mg/dL — ABNORMAL LOW (ref 18.0–45.0)
Prealbumin: 4.3 mg/dL — ABNORMAL LOW (ref 18.0–45.0)

## 2010-03-25 LAB — PHOSPHORUS
Phosphorus: 2.8 mg/dL (ref 2.3–4.6)
Phosphorus: 3.4 mg/dL (ref 2.3–4.6)
Phosphorus: 3.8 mg/dL (ref 2.3–4.6)
Phosphorus: 4.9 mg/dL — ABNORMAL HIGH (ref 2.3–4.6)
Phosphorus: 4.9 mg/dL — ABNORMAL HIGH (ref 2.3–4.6)

## 2010-03-25 LAB — URINALYSIS, ROUTINE W REFLEX MICROSCOPIC
Glucose, UA: NEGATIVE mg/dL
Leukocytes, UA: NEGATIVE
Nitrite: NEGATIVE
Specific Gravity, Urine: 1.015 (ref 1.005–1.030)
pH: 7 (ref 5.0–8.0)

## 2010-03-25 LAB — TRIGLYCERIDES: Triglycerides: 88 mg/dL (ref <150)

## 2010-03-25 LAB — URINE CULTURE
Culture: NO GROWTH
Special Requests: NEGATIVE

## 2010-03-25 LAB — URINE MICROSCOPIC-ADD ON

## 2010-03-26 LAB — URINALYSIS, ROUTINE W REFLEX MICROSCOPIC
Glucose, UA: 100 mg/dL — AB
Glucose, UA: NEGATIVE mg/dL
Ketones, ur: 15 mg/dL — AB
Nitrite: POSITIVE — AB
pH: 6 (ref 5.0–8.0)
pH: 6.5 (ref 5.0–8.0)

## 2010-03-26 LAB — COMPREHENSIVE METABOLIC PANEL
ALT: 39 U/L — ABNORMAL HIGH (ref 0–35)
AST: 231 U/L — ABNORMAL HIGH (ref 0–37)
Albumin: 2.5 g/dL — ABNORMAL LOW (ref 3.5–5.2)
Albumin: 3.7 g/dL (ref 3.5–5.2)
Alkaline Phosphatase: 107 U/L (ref 39–117)
Alkaline Phosphatase: 68 U/L (ref 39–117)
Alkaline Phosphatase: 69 U/L (ref 39–117)
BUN: 2 mg/dL — ABNORMAL LOW (ref 6–23)
BUN: 7 mg/dL (ref 6–23)
Calcium: 7.2 mg/dL — ABNORMAL LOW (ref 8.4–10.5)
Calcium: 8.1 mg/dL — ABNORMAL LOW (ref 8.4–10.5)
Chloride: 100 mEq/L (ref 96–112)
GFR calc Af Amer: >60 mL/min (ref >60)
Glucose, Bld: 100 mg/dL — ABNORMAL HIGH (ref 70–99)
Glucose, Bld: 103 mg/dL — ABNORMAL HIGH (ref 70–99)
Potassium: 3.4 mEq/L — ABNORMAL LOW (ref 3.5–5.1)
Potassium: 3.6 mEq/L (ref 3.5–5.1)
Sodium: 136 mEq/L (ref 135–145)
Sodium: 142 mEq/L (ref 135–145)
Total Bilirubin: 1.6 mg/dL — ABNORMAL HIGH (ref 0.3–1.2)
Total Protein: 4.7 g/dL — ABNORMAL LOW (ref 6.0–8.3)
Total Protein: 5.1 g/dL — ABNORMAL LOW (ref 6.0–8.3)

## 2010-03-26 LAB — BASIC METABOLIC PANEL
BUN: 5 mg/dL — ABNORMAL LOW (ref 6–23)
BUN: 6 mg/dL (ref 6–23)
CO2: 23 mEq/L (ref 19–32)
CO2: 24 mEq/L (ref 19–32)
CO2: 24 mEq/L (ref 19–32)
Calcium: 7.5 mg/dL — ABNORMAL LOW (ref 8.4–10.5)
Chloride: 101 mEq/L (ref 96–112)
Chloride: 104 mEq/L (ref 96–112)
Chloride: 105 mEq/L (ref 96–112)
Creatinine, Ser: 0.5 mg/dL (ref 0.4–1.2)
GFR calc non Af Amer: >60 mL/min (ref >60)
Glucose, Bld: 115 mg/dL — ABNORMAL HIGH (ref 70–99)
Glucose, Bld: 178 mg/dL — ABNORMAL HIGH (ref 70–99)
Glucose, Bld: 63 mg/dL — ABNORMAL LOW (ref 70–99)
Potassium: 2.6 mEq/L — CL (ref 3.5–5.1)
Sodium: 136 mEq/L (ref 135–145)
Sodium: 137 mEq/L (ref 135–145)

## 2010-03-26 LAB — DIFFERENTIAL
Basophils Absolute: 0 10*3/uL (ref 0.0–0.1)
Basophils Relative: 0 % (ref 0–1)
Basophils Relative: 0 % (ref 0–1)
Basophils Relative: 0 % (ref 0–1)
Basophils Relative: 0 % (ref 0–1)
Basophils Relative: 0 % (ref 0–1)
Eosinophils Absolute: 0 10*3/uL (ref 0.0–0.7)
Eosinophils Absolute: 0.1 10*3/uL (ref 0.0–0.7)
Eosinophils Relative: 0 % (ref 0–5)
Eosinophils Relative: 1 % (ref 0–5)
Lymphocytes Relative: 6 % — ABNORMAL LOW (ref 12–46)
Lymphocytes Relative: 9 % — ABNORMAL LOW (ref 12–46)
Lymphs Abs: 0.7 10*3/uL (ref 0.7–4.0)
Lymphs Abs: 0.8 10*3/uL (ref 0.7–4.0)
Lymphs Abs: 1.1 10*3/uL (ref 0.7–4.0)
Lymphs Abs: 1.4 10*3/uL (ref 0.7–4.0)
Monocytes Absolute: 0.8 10*3/uL (ref 0.1–1.0)
Monocytes Absolute: 0.8 10*3/uL (ref 0.1–1.0)
Monocytes Absolute: 0.8 10*3/uL (ref 0.1–1.0)
Monocytes Absolute: 1.6 10*3/uL — ABNORMAL HIGH (ref 0.1–1.0)
Monocytes Absolute: 2.8 10*3/uL — ABNORMAL HIGH (ref 0.1–1.0)
Monocytes Relative: 14 % — ABNORMAL HIGH (ref 3–12)
Monocytes Relative: 18 % — ABNORMAL HIGH (ref 3–12)
Monocytes Relative: 6 % (ref 3–12)
Monocytes Relative: 7 % (ref 3–12)
Neutro Abs: 11.3 10*3/uL — ABNORMAL HIGH (ref 1.7–7.7)
Neutro Abs: 11.9 10*3/uL — ABNORMAL HIGH (ref 1.7–7.7)
Neutro Abs: 9.1 10*3/uL — ABNORMAL HIGH (ref 1.7–7.7)
Neutrophils Relative %: 72 % (ref 43–77)
Neutrophils Relative %: 76 % (ref 43–77)
Neutrophils Relative %: 86 % — ABNORMAL HIGH (ref 43–77)
Neutrophils Relative %: 88 % — ABNORMAL HIGH (ref 43–77)
Neutrophils Relative %: 88 % — ABNORMAL HIGH (ref 43–77)

## 2010-03-26 LAB — CBC
HCT: 38.9 % (ref 36.0–46.0)
HCT: 41.5 % (ref 36.0–46.0)
HCT: 42.5 % (ref 36.0–46.0)
Hemoglobin: 13.4 g/dL (ref 12.0–15.0)
Hemoglobin: 14.9 g/dL (ref 12.0–15.0)
Hemoglobin: 15.5 g/dL — ABNORMAL HIGH (ref 12.0–15.0)
MCH: 32.8 pg (ref 26.0–34.0)
MCH: 33.9 pg (ref 26.0–34.0)
MCHC: 34.4 g/dL (ref 30.0–36.0)
MCHC: 34.8 g/dL (ref 30.0–36.0)
MCHC: 35.4 g/dL (ref 30.0–36.0)
MCHC: 35.6 g/dL (ref 30.0–36.0)
MCHC: 35.9 g/dL (ref 30.0–36.0)
MCV: 95.3 fL (ref 78.0–100.0)
MCV: 95.9 fL (ref 78.0–100.0)
Platelets: 132 10*3/uL — ABNORMAL LOW (ref 150–400)
Platelets: 96 10*3/uL — ABNORMAL LOW (ref 150–400)
RBC: 4.42 MIL/uL (ref 3.87–5.11)
RBC: 4.94 MIL/uL (ref 3.87–5.11)
RDW: 13.2 % (ref 11.5–15.5)
RDW: 13.2 % (ref 11.5–15.5)
RDW: 13.4 % (ref 11.5–15.5)
WBC: 11.9 10*3/uL — ABNORMAL HIGH (ref 4.0–10.5)
WBC: 13.4 10*3/uL — ABNORMAL HIGH (ref 4.0–10.5)

## 2010-03-26 LAB — LIPASE, BLOOD
Lipase: 148 U/L — ABNORMAL HIGH (ref 11–59)
Lipase: 515 U/L — ABNORMAL HIGH (ref 11–59)

## 2010-03-26 LAB — URINE MICROSCOPIC-ADD ON

## 2010-03-26 LAB — GLUCOSE, CAPILLARY
Glucose-Capillary: 106 mg/dL — ABNORMAL HIGH (ref 70–99)
Glucose-Capillary: 123 mg/dL — ABNORMAL HIGH (ref 70–99)
Glucose-Capillary: 145 mg/dL — ABNORMAL HIGH (ref 70–99)
Glucose-Capillary: 147 mg/dL — ABNORMAL HIGH (ref 70–99)

## 2010-03-26 LAB — POCT CARDIAC MARKERS: Troponin i, poc: <0.05 ng/mL (ref 0.00–0.09)

## 2010-03-26 LAB — HEMOGLOBIN A1C
Hgb A1c MFr Bld: 4.9 % (ref <5.7)
Mean Plasma Glucose: 94 mg/dL (ref <117)

## 2010-03-26 LAB — PROTIME-INR: Prothrombin Time: 15.4 seconds — ABNORMAL HIGH (ref 11.6–15.2)

## 2010-03-26 LAB — RAPID URINE DRUG SCREEN, HOSP PERFORMED
Cocaine: NOT DETECTED
Opiates: POSITIVE — AB

## 2010-05-31 NOTE — H&P (Signed)
[facility]  Patient:    [name], [name] Visit Number: [id number] MRN: [id number]          Service Type: PSY Location: 500 0502 01 Attending Physician:  Rachael Fee Dictated by:   Candi Leash. Orsini, N.P. Admit Date:  10/27/2000                     Psychiatric Admission Assessment  CONTINUATION  DIAGNOSES: Axis I:    1. Depression not otherwise specified.            2. Adjustment disorder. Axis II:   Deferred. Axis III:  None. Axis IV:   Problems with primary support group and other psychosocial            problems. Axis V:    Current 30; past year 74-75.  PLAN:  Involuntary commitment for depression and polysubstance abuse. Contract for safety.  Check every 15 minutes.  Place on low dose Librium. Obtain labs.  Goal is to return patient to prior living arrangements, decrease depressive symptoms so patient can be safe and functional, to follow up with mental health.  TENTATIVE LENGTH OF STAY:  Two to three days. Dictated by:   Candi Leash. Orsini, N.P. Attending Physician:  Rachael Fee DD:  10/28/00 TD:  10/29/00 Job: [id number]
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
## 2017-05-09 ENCOUNTER — Emergency Department (HOSPITAL_COMMUNITY): Payer: BLUE CROSS/BLUE SHIELD

## 2017-05-09 ENCOUNTER — Inpatient Hospital Stay (HOSPITAL_COMMUNITY)
Admission: EM | Admit: 2017-05-09 | Discharge: 2017-05-12 | DRG: 291 | Disposition: A | Discharge status: Home / Self Care | Payer: BLUE CROSS/BLUE SHIELD | Attending: Family Medicine | Admitting: Family Medicine

## 2017-05-09 ENCOUNTER — Encounter (HOSPITAL_COMMUNITY): Payer: Self-pay | Admitting: Emergency Medicine

## 2017-05-09 ENCOUNTER — Other Ambulatory Visit: Payer: Self-pay

## 2017-05-09 DIAGNOSIS — J9601 Acute respiratory failure with hypoxia: Secondary | ICD-10-CM | POA: Diagnosis present

## 2017-05-09 DIAGNOSIS — R05 Cough: Secondary | ICD-10-CM | POA: Diagnosis not present

## 2017-05-09 DIAGNOSIS — E876 Hypokalemia: Secondary | ICD-10-CM | POA: Diagnosis not present

## 2017-05-09 DIAGNOSIS — Z79899 Other long term (current) drug therapy: Secondary | ICD-10-CM

## 2017-05-09 DIAGNOSIS — F17210 Nicotine dependence, cigarettes, uncomplicated: Secondary | ICD-10-CM | POA: Diagnosis present

## 2017-05-09 DIAGNOSIS — Z9049 Acquired absence of other specified parts of digestive tract: Secondary | ICD-10-CM | POA: Diagnosis not present

## 2017-05-09 DIAGNOSIS — I110 Hypertensive heart disease with heart failure: Principal | ICD-10-CM | POA: Diagnosis present

## 2017-05-09 DIAGNOSIS — Z888 Allergy status to other drugs, medicaments and biological substances status: Secondary | ICD-10-CM

## 2017-05-09 DIAGNOSIS — R0600 Dyspnea, unspecified: Secondary | ICD-10-CM | POA: Diagnosis not present

## 2017-05-09 DIAGNOSIS — Z23 Encounter for immunization: Secondary | ICD-10-CM | POA: Diagnosis not present

## 2017-05-09 DIAGNOSIS — J441 Chronic obstructive pulmonary disease with (acute) exacerbation: Secondary | ICD-10-CM | POA: Diagnosis present

## 2017-05-09 DIAGNOSIS — R7989 Other specified abnormal findings of blood chemistry: Secondary | ICD-10-CM

## 2017-05-09 DIAGNOSIS — I509 Heart failure, unspecified: Secondary | ICD-10-CM | POA: Diagnosis not present

## 2017-05-09 DIAGNOSIS — Z7982 Long term (current) use of aspirin: Secondary | ICD-10-CM

## 2017-05-09 DIAGNOSIS — R778 Other specified abnormalities of plasma proteins: Secondary | ICD-10-CM

## 2017-05-09 DIAGNOSIS — Z800 Family history of malignant neoplasm of digestive organs: Secondary | ICD-10-CM | POA: Diagnosis not present

## 2017-05-09 DIAGNOSIS — I10 Essential (primary) hypertension: Secondary | ICD-10-CM | POA: Diagnosis present

## 2017-05-09 DIAGNOSIS — Z9851 Tubal ligation status: Secondary | ICD-10-CM

## 2017-05-09 DIAGNOSIS — Z801 Family history of malignant neoplasm of trachea, bronchus and lung: Secondary | ICD-10-CM | POA: Diagnosis not present

## 2017-05-09 DIAGNOSIS — Z885 Allergy status to narcotic agent status: Secondary | ICD-10-CM | POA: Diagnosis not present

## 2017-05-09 DIAGNOSIS — I5021 Acute systolic (congestive) heart failure: Secondary | ICD-10-CM | POA: Diagnosis not present

## 2017-05-09 DIAGNOSIS — R748 Abnormal levels of other serum enzymes: Secondary | ICD-10-CM | POA: Diagnosis not present

## 2017-05-09 LAB — COMPREHENSIVE METABOLIC PANEL
ALK PHOS: 109 U/L (ref 38–126)
ALT: 17 U/L (ref 14–54)
AST: 21 U/L (ref 15–41)
Albumin: 3.8 g/dL (ref 3.5–5.0)
Anion gap: 16 — ABNORMAL HIGH (ref 5–15)
BUN: 10 mg/dL (ref 6–20)
CALCIUM: 9.2 mg/dL (ref 8.9–10.3)
CHLORIDE: 94 mmol/L — AB (ref 101–111)
CO2: 28 mmol/L (ref 22–32)
CREATININE: 0.61 mg/dL (ref 0.44–1.00)
GFR calc Af Amer: >60 mL/min (ref >60)
GFR calc non Af Amer: >60 mL/min (ref >60)
Glucose, Bld: 104 mg/dL — ABNORMAL HIGH (ref 65–99)
Potassium: 3.6 mmol/L (ref 3.5–5.1)
Sodium: 138 mmol/L (ref 135–145)
Total Bilirubin: 2 mg/dL — ABNORMAL HIGH (ref 0.3–1.2)
Total Protein: 7.3 g/dL (ref 6.5–8.1)

## 2017-05-09 LAB — CBC WITH DIFFERENTIAL/PLATELET
BASOS ABS: 0 10*3/uL (ref 0.0–0.1)
Basophils Relative: 0 %
EOS PCT: 0 %
Eosinophils Absolute: 0.1 10*3/uL (ref 0.0–0.7)
HCT: 53.4 % — ABNORMAL HIGH (ref 36.0–46.0)
HEMOGLOBIN: 17 g/dL — AB (ref 12.0–15.0)
Lymphocytes Relative: 15 %
Lymphs Abs: 2.1 10*3/uL (ref 0.7–4.0)
MCH: 32.8 pg (ref 26.0–34.0)
MCHC: 31.8 g/dL (ref 30.0–36.0)
MCV: 102.9 fL — ABNORMAL HIGH (ref 78.0–100.0)
Monocytes Absolute: 1 10*3/uL (ref 0.1–1.0)
Monocytes Relative: 7 %
NEUTROS PCT: 78 %
Neutro Abs: 11.1 10*3/uL — ABNORMAL HIGH (ref 1.7–7.7)
PLATELETS: 158 10*3/uL (ref 150–400)
RBC: 5.19 MIL/uL — AB (ref 3.87–5.11)
RDW: 12.7 % (ref 11.5–15.5)
WBC: 14.3 10*3/uL — AB (ref 4.0–10.5)

## 2017-05-09 LAB — BRAIN NATRIURETIC PEPTIDE: B Natriuretic Peptide: 615 pg/mL — ABNORMAL HIGH (ref 0.0–100.0)

## 2017-05-09 LAB — TROPONIN I
TROPONIN I: 0.03 ng/mL — AB (ref <0.03)
Troponin I: 0.03 ng/mL (ref <0.03)

## 2017-05-09 MED ORDER — METOPROLOL TARTRATE 5 MG/5ML IV SOLN
5.0000 mg | Freq: Once | INTRAVENOUS | Status: AC
  Administered 2017-05-09: 5 mg via INTRAVENOUS
  Filled 2017-05-09: qty 5

## 2017-05-09 MED ORDER — ALBUTEROL SULFATE (2.5 MG/3ML) 0.083% IN NEBU
5.0000 mg | INHALATION_SOLUTION | Freq: Once | RESPIRATORY_TRACT | Status: AC
  Administered 2017-05-09: 5 mg via RESPIRATORY_TRACT
  Filled 2017-05-09: qty 6

## 2017-05-09 MED ORDER — FUROSEMIDE 20 MG PO TABS
20.0000 mg | ORAL_TABLET | Freq: Every day | ORAL | Status: DC
  Administered 2017-05-09 – 2017-05-10 (×2): 20 mg via ORAL
  Filled 2017-05-09 (×2): qty 1

## 2017-05-09 MED ORDER — PNEUMOCOCCAL VAC POLYVALENT 25 MCG/0.5ML IJ INJ
0.5000 mL | INJECTION | INTRAMUSCULAR | Status: AC
  Administered 2017-05-10: 0.5 mL via INTRAMUSCULAR
  Filled 2017-05-09: qty 0.5

## 2017-05-09 MED ORDER — SODIUM CHLORIDE 0.9% FLUSH: 3.0000 mL | INTRAVENOUS | Status: DC | PRN

## 2017-05-09 MED ORDER — SODIUM CHLORIDE 0.9% FLUSH
3.0000 mL | Freq: Two times a day (BID) | INTRAVENOUS | Status: DC
  Administered 2017-05-09 – 2017-05-12 (×6): 3 mL via INTRAVENOUS

## 2017-05-09 MED ORDER — FUROSEMIDE 10 MG/ML IJ SOLN
40.0000 mg | Freq: Once | INTRAMUSCULAR | Status: DC
Start: 2017-05-09 — End: 2017-05-09

## 2017-05-09 MED ORDER — ALBUTEROL SULFATE (2.5 MG/3ML) 0.083% IN NEBU
INHALATION_SOLUTION | RESPIRATORY_TRACT | Status: AC
  Administered 2017-05-09: 2.5 mg
  Filled 2017-05-09: qty 3

## 2017-05-09 MED ORDER — ACETAMINOPHEN 325 MG PO TABS
650.0000 mg | ORAL_TABLET | ORAL | Status: DC | PRN
  Administered 2017-05-10 – 2017-05-12 (×5): 650 mg via ORAL
  Filled 2017-05-09 (×5): qty 2

## 2017-05-09 MED ORDER — IPRATROPIUM-ALBUTEROL 0.5-2.5 (3) MG/3ML IN SOLN
3.0000 mL | Freq: Four times a day (QID) | RESPIRATORY_TRACT | Status: DC
  Administered 2017-05-09 – 2017-05-10 (×3): 3 mL via RESPIRATORY_TRACT
  Filled 2017-05-09 (×3): qty 3

## 2017-05-09 MED ORDER — ONDANSETRON HCL 4 MG/2ML IJ SOLN: 4.0000 mg | Freq: Four times a day (QID) | INTRAMUSCULAR | Status: DC | PRN

## 2017-05-09 MED ORDER — ALBUTEROL SULFATE (2.5 MG/3ML) 0.083% IN NEBU
2.5000 mg | INHALATION_SOLUTION | RESPIRATORY_TRACT | Status: DC | PRN
  Administered 2017-05-09: 2.5 mg via RESPIRATORY_TRACT
  Filled 2017-05-09: qty 3

## 2017-05-09 MED ORDER — ALBUTEROL SULFATE (2.5 MG/3ML) 0.083% IN NEBU
5.0000 mg | INHALATION_SOLUTION | Freq: Once | RESPIRATORY_TRACT | Status: AC
  Administered 2017-05-09: 5 mg via RESPIRATORY_TRACT

## 2017-05-09 MED ORDER — ENOXAPARIN SODIUM 40 MG/0.4ML ~~LOC~~ SOLN
40.0000 mg | SUBCUTANEOUS | Status: DC
  Administered 2017-05-09 – 2017-05-11 (×3): 40 mg via SUBCUTANEOUS
  Filled 2017-05-09 (×3): qty 0.4

## 2017-05-09 MED ORDER — SODIUM CHLORIDE 0.9 % IV SOLN: 250.0000 mL | INTRAVENOUS | Status: DC | PRN

## 2017-05-09 MED ORDER — PREDNISONE 20 MG PO TABS
40.0000 mg | ORAL_TABLET | Freq: Once | ORAL | Status: AC
  Administered 2017-05-09: 40 mg via ORAL
  Filled 2017-05-09: qty 2

## 2017-05-09 MED ORDER — FUROSEMIDE 10 MG/ML IJ SOLN
40.0000 mg | Freq: Once | INTRAMUSCULAR | Status: AC
  Administered 2017-05-09: 40 mg via INTRAVENOUS
  Filled 2017-05-09: qty 4

## 2017-05-09 MED ORDER — ASPIRIN 81 MG PO CHEW
324.0000 mg | CHEWABLE_TABLET | Freq: Once | ORAL | Status: AC
  Administered 2017-05-09: 324 mg via ORAL
  Filled 2017-05-09: qty 4

## 2017-05-09 MED ORDER — PREDNISONE 20 MG PO TABS
40.0000 mg | ORAL_TABLET | Freq: Every day | ORAL | Status: DC
  Administered 2017-05-10: 40 mg via ORAL
  Filled 2017-05-09: qty 2

## 2017-05-09 MED ORDER — ALBUTEROL SULFATE (2.5 MG/3ML) 0.083% IN NEBU
5.0000 mg | INHALATION_SOLUTION | Freq: Once | RESPIRATORY_TRACT | Status: DC
Start: 2017-05-09 — End: 2017-05-09
  Filled 2017-05-09: qty 6

## 2017-05-09 MED ORDER — LOSARTAN POTASSIUM 50 MG PO TABS
25.0000 mg | ORAL_TABLET | Freq: Every day | ORAL | Status: DC
  Administered 2017-05-09 – 2017-05-11 (×3): 25 mg via ORAL
  Filled 2017-05-09 (×3): qty 1

## 2017-05-09 NOTE — Progress Notes (Signed)
Patient gets short of breath easily with exertion

## 2017-05-09 NOTE — ED Provider Notes (Signed)
[facility] EMERGENCY DEPARTMENT Provider Note   CSN: [id number] Arrival date & time: 05/09/17  1447     History   Chief Complaint Chief Complaint  Patient presents with  . Shortness of Breath    HPI [name] is a 57 y.o. female.  HPI 57 yo female with increasing dyspnea for two weeks with some symptoms for 2 years.  States ho hypertension but no meds.  Cough np, no fever.  Severe dyspnea on exertion. Endorses leg swelling, worse with laying flat or any exertion.  No fever, chills, nv/d.  Some loose stools today.  Past Medical History:  Diagnosis Date  . Anxiety   . ETOH abuse   . Hemorrhoids   . 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 ALCOHOL ABUSE, HX OF 01/30/2010  . PANCREATITIS, HX OF 01/30/2010    Past Surgical History:  Procedure Laterality Date  . CHOLECYSTECTOMY  90's  . ERCP     in the past with Dr.Rourk for CBD stone?  . TUBAL LIGATION       OB History    Gravida  4   Para  3   Term  3   Preterm      AB  1   Living        SAB  1   TAB      Ectopic      Multiple      Live Births               Home Medications    Prior to Admission medications   Medication Sig Start Date End Date Taking? Authorizing Provider  folic acid (FOLVITE) 1 MG tablet Take 1 mg by mouth daily.      [provider]  HYDROcodone-acetaminophen (VICODIN) 5-500 MG per tablet Take 1 tablet by mouth every 6 (six) hours as needed.      [provider]  Multiple Vitamin (MULTIVITAMIN) capsule Take 1 capsule by mouth daily.      [provider]  promethazine (PHENERGAN) 12.5 MG tablet Take 12.5 mg by mouth every 6 (six) hours as needed.      [provider]  thiamine 100 MG tablet Take 100 mg by mouth daily.      [provider]    Family History History reviewed. No pertinent family history.  Social History Social History   Tobacco  Use  . Smoking status: Current Every Day Smoker    Packs/day: 1.00    Types: Cigarettes  . Smokeless tobacco: Never Used  Substance Use Topics  . Alcohol use: Yes    Comment: occas  . Drug use: Never     Allergies   Lisinopril; Oxycodone-acetaminophen; and Propoxyphene n-acetaminophen   Review of Systems Review of Systems  Musculoskeletal: Positive for back pain.  All other systems reviewed and are negative.    Physical Exam Updated Vital Signs BP (!) 197/91 (BP Location: Left Arm)   Pulse 92   Temp 97.6 F (36.4 C) (Oral)   Resp 20   Ht 1.676 m (5\' 6" )   Wt 81.6 kg (180 lb)   SpO2 96%   BMI 29.05 kg/m   Physical Exam  Constitutional: She is oriented to person, place, and time. She appears well-developed and well-nourished. She does not appear ill. She appears distressed.  HENT:  Head: Normocephalic and atraumatic.  Mouth/Throat: Oropharynx is clear and  moist.  Eyes: Pupils are equal, round, and reactive to light.  Neck: Normal range of motion.  Cardiovascular: Normal rate.  Pulmonary/Chest: Tachypnea noted. She is in respiratory distress. She has decreased breath sounds. She has wheezes. She has rhonchi.  Abdominal: Soft. Bowel sounds are normal.  Musculoskeletal:       Right lower leg: She exhibits edema.  Neurological: She is alert and oriented to person, place, and time.  Skin: Skin is warm. Capillary refill takes less than 2 seconds.  Psychiatric: She has a normal mood and affect.  Nursing note and vitals reviewed.    ED Treatments / Results  Labs (all labs ordered are listed, but only abnormal results are displayed) Labs Reviewed  CBC WITH DIFFERENTIAL/PLATELET - Abnormal; Notable for the following components:      Result Value   WBC 14.3 (*)    RBC 5.19 (*)    Hemoglobin 17.0 (*)    HCT 53.4 (*)    MCV 102.9 (*)    Neutro Abs 11.1 (*)    All other components within normal limits  COMPREHENSIVE METABOLIC PANEL - Abnormal; Notable for the  following components:   Chloride 94 (*)    Glucose, Bld 104 (*)    Total Bilirubin 2.0 (*)    Anion gap 16 (*)    All other components within normal limits  BRAIN NATRIURETIC PEPTIDE - Abnormal; Notable for the following components:   B Natriuretic Peptide 615.0 (*)    All other components within normal limits  TROPONIN I - Abnormal; Notable for the following components:   Troponin I 0.03 (*)    All other components within normal limits    EKG EKG Interpretation  Date/Time:  Saturday May 09 2017 15:09:11 EDT Ventricular Rate:  85 PR Interval:    QRS Duration: 83 QT Interval:  404 QTC Calculation: 481 R Axis:   44 Text Interpretation:  Sinus rhythm Atrial premature complexes Non-specific ST-t changes Poor data quality Confirmed by Pattricia Boss [phone]) on 05/09/2017 4:51:38 PM   Radiology Dg Chest Port 1 View  Result Date: 05/09/2017 CLINICAL DATA:  COUGH, Patient reports difficulty breathing for the past 2 years. Patient states her breathing became much worse last night, has felt like "there's no air in the room." Patient reports non productive coughHISTORY OF ANXIETY, ETOH ABUSE EXAM: PORTABLE CHEST 1 VIEW COMPARISON:  01/05/2010 FINDINGS: Cardiac silhouette is mildly enlarged. No mediastinal or hilar masses. No evidence of adenopathy. Clear lungs. No pleural effusion.  No pneumothorax. Skeletal structures are grossly intact. IMPRESSION: No acute cardiopulmonary disease. Electronically Signed   By: Lajean Manes M.D.   On: 05/09/2017 16:14    Procedures Procedures (including critical care time)  Medications Ordered in ED Medications  albuterol (PROVENTIL) (2.5 MG/3ML) 0.083% nebulizer solution 5 mg (5 mg Nebulization Given 05/09/17 1511)  predniSONE (DELTASONE) tablet 40 mg (40 mg Oral Given 05/09/17 1551)  albuterol (PROVENTIL) (2.5 MG/3ML) 0.083% nebulizer solution 5 mg (5 mg Nebulization Given 05/09/17 1525)  furosemide (LASIX) injection 40 mg (40 mg Intravenous Given  05/09/17 1551)  metoprolol tartrate (LOPRESSOR) injection 5 mg (5 mg Intravenous Given 05/09/17 1702)  aspirin chewable tablet 324 mg (324 mg Oral Given 05/09/17 1702)     Initial Impression / Assessment and Plan / ED Course  I have reviewed the triage vital signs and the nursing notes.  Pertinent labs & imaging results that were available during my care of the patient were reviewed by me and considered in my medical  decision making (see chart for details).     Patient here with dyspnea- new oxygen requirement- ddx- hypertension, copd, asthma, chf- troponin mildly elevated, significant hypertension,  Increased uop with lasix and symptoms improved with diuresis and bronchodilators.  Plan admission for further treatment and evaluation Trend troponin-aspirin and lopressor given with hypertension and elevated troponin ?echo Continue diuresis bronchodilatores Discussed with Dr. Nehemiah Settle and he will see for admission  Multiple rechecks of breathing with respiratory distress Significantly hypertensive with iv meds for bp control CRITICAL CARE Performed by: Pattricia Boss Total critical care time: 45 minutes Critical care time was exclusive of separately billable procedures and treating other patients. Critical care was necessary to treat or prevent imminent or life-threatening deterioration. Critical care was time spent personally by me on the following activities: development of treatment plan with patient and/or surrogate as well as nursing, discussions with consultants, evaluation of patient's response to treatment, examination of patient, obtaining history from patient or surrogate, ordering and performing treatments and interventions, ordering and review of laboratory studies, ordering and review of radiographic studies, pulse oximetry and re-evaluation of patient's condition.  Final Clinical Impressions(s) / ED Diagnoses   Final diagnoses:  Dyspnea, unspecified type  COPD exacerbation  ([place])  Hypertension, unspecified type  Elevated troponin    ED Discharge Orders    None       Pattricia Boss, MD 05/09/17 1742

## 2017-05-09 NOTE — ED Notes (Signed)
Report to Daniel, RN

## 2017-05-09 NOTE — ED Notes (Signed)
Call to floor  Nurse unable to take report

## 2017-05-09 NOTE — ED Notes (Signed)
Bedside commode placed in pt room  

## 2017-05-09 NOTE — ED Notes (Signed)
Dr Nehemiah Settle has seen

## 2017-05-09 NOTE — ED Notes (Signed)
Dr R informed of Critical trop of 0.03

## 2017-05-09 NOTE — ED Notes (Signed)
Out of bed to bedside commode  Pt becomes extremely dyspneic while moving the 3 ft to the stretcher

## 2017-05-09 NOTE — ED Notes (Signed)
Pt reports cmoking 1-2 PPD fpr years  Breathing worse for the last 2 years but much worse for the last 2 days  Has no PCP  Has not seen a physician "in years"

## 2017-05-09 NOTE — ED Notes (Signed)
Dr Ray in to assess 

## 2017-05-09 NOTE — ED Notes (Signed)
Call to floor for report 

## 2017-05-09 NOTE — H&P (Signed)
History and Physical  [name] IRC:[id number] DOB: 1960/05/31 DOA: 05/09/2017  Referring physician: Dr Eulis Foster, ED physician PCP: Patient, No Pcp Per  Outpatient Specialists:   Patient Coming From: home  Chief Complaint: SOB  HPI: [name] is a 57 y.o. female with a history of EtOH presents with SOB that started 2 days ago.  Symptoms are worsening.  Worse with exertion and improves with rest.  She is a tobacco user and reportedly smokes 2 cigarettes on average, although nursing notes report a pack a day.  Came to the emergency department due to feeling like she could not keep to catch her breath.  Emergency Department Course: Patient acutely hypoxic in the emergency department started on oxygen nasal cannula with good improvement.  Received nebulizer treatments.  Given steroid.  Feels breathing is improving.  Chest x-ray suggestive of pulmonary edema.  BNP elevated at 615 and a troponin of 0.03  Review of Systems:   Pt denies any fevers, chills, nausea, vomiting, diarrhea, constipation, abdominal pain, shortness of breath, dyspnea on exertion, orthopnea, cough, wheezing, palpitations, headache, vision changes, lightheadedness, dizziness, melena, rectal bleeding.  Review of systems are otherwise negative  Past Medical History:  Diagnosis Date  . Anxiety   . ETOH abuse   . Hemorrhoids   . 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 CHOLECYSTECTOMY  90's  . ERCP     in the past with Dr.Rourk for CBD stone?  . TUBAL LIGATION     Social History:  reports that she has been smoking cigarettes.  She has been smoking about 1.00 pack per day. She has never used smokeless tobacco. She reports that she drinks alcohol. She reports that she does not use drugs. Patient lives at home  Allergies  Allergen Reactions  . Lisinopril Hives    REACTION: Breaks out in whelts  . Oxycodone-Acetaminophen Itching    REACTION:  Causes itchin  . Propoxyphene N-Acetaminophen Itching    History reviewed. No pertinent family history.  Parents deceased from lung cancer and liver cancer.  Prior to Admission medications   Medication Sig Start Date End Date Taking? Authorizing Provider  aspirin EC 325 MG tablet Take 325 mg by mouth daily as needed for mild pain.   Yes [provider]    Physical Exam: BP (!) 168/91   Pulse 83   Temp 97.6 F (36.4 C) (Oral)   Resp 15   Ht 5\' 6"  (1.676 m)   Wt 81.6 kg (180 lb)   SpO2 93%   BMI 29.05 kg/m   . General: Middle-aged Caucasian female. Awake and alert and oriented x3. No acute cardiopulmonary distress.  Marland Kitchen HEENT: Normocephalic atraumatic.  Right and left ears normal in appearance.  Pupils equal, round, reactive to light. Extraocular muscles are intact. Sclerae anicteric and noninjected.  Moist mucosal membranes. No mucosal lesions.  . Neck: Neck supple without lymphadenopathy. No carotid bruits. No masses palpated.  . Cardiovascular: Regular rate with normal S1-S2 sounds. No murmurs, rubs, gallops auscultated. No JVD.  Marland Kitchen Respiratory: Diffuse wheezing throughout.  Prolonged exhalation phase.  No accessory muscle use. . Abdomen: Soft, nontender, nondistended. Active bowel sounds. No masses or hepatosplenomegaly  . Skin: No rashes, lesions, or ulcerations.  Dry, warm to touch. 2+ dorsalis pedis and radial pulses. . Musculoskeletal: No calf or leg pain. All major joints not erythematous nontender.  No upper or lower joint deformation.  Good ROM.  No contractures  . Psychiatric: Intact judgment and insight. Pleasant and cooperative. . Neurologic: No focal neurological deficits. Strength is 5/5 and symmetric in upper and lower extremities.  Cranial nerves II through XII are grossly intact.           Labs on Admission: I have personally reviewed following labs and imaging studies  CBC: Recent Labs  Lab 05/09/17 1523  WBC 14.3*  NEUTROABS 11.1*  HGB 17.0*  HCT  53.4*  MCV 102.9*  PLT 782   Basic Metabolic Panel: Recent Labs  Lab 05/09/17 1523  NA 138  K 3.6  CL 94*  CO2 28  GLUCOSE 104*  BUN 10  CREATININE 0.61  CALCIUM 9.2   GFR: Estimated Creatinine Clearance: 85.5 mL/min (by C-G formula based on SCr of 0.61 mg/dL). Liver Function Tests: Recent Labs  Lab 05/09/17 1523  AST 21  ALT 17  ALKPHOS 109  BILITOT 2.0*  PROT 7.3  ALBUMIN 3.8   No results for input(s): LIPASE, AMYLASE in the last 168 hours. No results for input(s): AMMONIA in the last 168 hours. Coagulation Profile: No results for input(s): INR, PROTIME in the last 168 hours. Cardiac Enzymes: Recent Labs  Lab 05/09/17 1523  TROPONINI 0.03*   BNP (last 3 results) No results for input(s): PROBNP in the last 8760 hours. HbA1C: No results for input(s): HGBA1C in the last 72 hours. CBG: No results for input(s): GLUCAP in the last 168 hours. Lipid Profile: No results for input(s): CHOL, HDL, LDLCALC, TRIG, CHOLHDL, LDLDIRECT in the last 72 hours. Thyroid Function Tests: No results for input(s): TSH, T4TOTAL, FREET4, T3FREE, THYROIDAB in the last 72 hours. Anemia Panel: No results for input(s): VITAMINB12, FOLATE, FERRITIN, TIBC, IRON, RETICCTPCT in the last 72 hours. Urine analysis:    Component Value Date/Time   COLORURINE YELLOW 12/27/2009 1935   APPEARANCEUR CLEAR 12/27/2009 1935   LABSPEC 1.015 12/27/2009 1935   PHURINE 7.0 12/27/2009 1935   GLUCOSEU NEGATIVE 12/27/2009 1935   HGBUR LARGE (A) 12/27/2009 1935   BILIRUBINUR NEGATIVE 12/27/2009 1935   KETONESUR NEGATIVE 12/27/2009 1935   PROTEINUR NEGATIVE 12/27/2009 1935   UROBILINOGEN 0.2 12/27/2009 1935   NITRITE NEGATIVE 12/27/2009 1935   LEUKOCYTESUR NEGATIVE 12/27/2009 1935   Sepsis Labs: @LABRCNTIP (procalcitonin:4,lacticidven:4) )No results found for this or any previous visit (from the past 240 hour(s)).   Radiological Exams on Admission: Dg Chest Port 1 View  Result Date:  05/09/2017 CLINICAL DATA:  COUGH, Patient reports difficulty breathing for the past 2 years. Patient states her breathing became much worse last night, has felt like "there's no air in the room." Patient reports non productive coughHISTORY OF ANXIETY, ETOH ABUSE EXAM: PORTABLE CHEST 1 VIEW COMPARISON:  01/05/2010 FINDINGS: Cardiac silhouette is mildly enlarged. No mediastinal or hilar masses. No evidence of adenopathy. Clear lungs. No pleural effusion.  No pneumothorax. Skeletal structures are grossly intact. IMPRESSION: No acute cardiopulmonary disease. Electronically Signed   By: Lajean Manes M.D.   On: 05/09/2017 16:14    EKG: Independently reviewed.  Sinus rhythm.  Question left atrial enlargement.  No acute ST changes.  Assessment/Plan: Principal Problem:   Acute respiratory failure with hypoxia (HCC) Active Problems:   COPD with acute exacerbation (HCC)   Essential hypertension   Acute systolic CHF (congestive heart failure) ([place])    This patient was discussed with the ED physician, including pertinent vitals, physical exam findings, labs, and imaging.  We also discussed care given by the ED provider.  1.  Acute respiratory failure with hypoxia a. Observation overnight b. Continue with oxygen therapy as needed 2. COPD with acute exacerbation Antibiotics: None needed at this time DuoNeb's every 6 scheduled with albuterol every 2 when necessary Continue inhaled steroids and LA bronchodilator Prednisone 40 Mucinex mild 3. Acute systolic CHF Mild Telemetry monitoring Strict I/O Daily Weights Diuresis: Lasix p.o. Echo cardiac exam tomorrow Repeat BMP tomorrow 4. Hypertension a. Start ARB  DVT prophylaxis: Lovenox Consultants: None Code Status: Full code son in the room Family Communication: During history and exam Disposition Plan: Home following improvement of respiratory status   Truett Mainland, DO [facility] Pager [phone]  If 7PM-7AM, please contact  night-coverage [email or web address] Password [email or web address]

## 2017-05-09 NOTE — ED Notes (Signed)
Pt has been out of bed xc 2 without notifying staff to BR UA to assess urine output

## 2017-05-09 NOTE — ED Triage Notes (Signed)
Patient reports difficulty breathing for the past 2 years. Patient states her breathing became much worse last night, has felt like "there's no air in the room." Patient reports non productive cough. Sat 91-92 % in Triage.

## 2017-05-10 ENCOUNTER — Observation Stay (HOSPITAL_BASED_OUTPATIENT_CLINIC_OR_DEPARTMENT_OTHER): Payer: BLUE CROSS/BLUE SHIELD

## 2017-05-10 DIAGNOSIS — J441 Chronic obstructive pulmonary disease with (acute) exacerbation: Secondary | ICD-10-CM

## 2017-05-10 DIAGNOSIS — R748 Abnormal levels of other serum enzymes: Secondary | ICD-10-CM | POA: Diagnosis present

## 2017-05-10 DIAGNOSIS — I110 Hypertensive heart disease with heart failure: Secondary | ICD-10-CM | POA: Diagnosis present

## 2017-05-10 DIAGNOSIS — I509 Heart failure, unspecified: Secondary | ICD-10-CM | POA: Diagnosis not present

## 2017-05-10 DIAGNOSIS — Z9851 Tubal ligation status: Secondary | ICD-10-CM | POA: Diagnosis not present

## 2017-05-10 DIAGNOSIS — Z885 Allergy status to narcotic agent status: Secondary | ICD-10-CM | POA: Diagnosis not present

## 2017-05-10 DIAGNOSIS — I10 Essential (primary) hypertension: Secondary | ICD-10-CM | POA: Diagnosis not present

## 2017-05-10 DIAGNOSIS — F17210 Nicotine dependence, cigarettes, uncomplicated: Secondary | ICD-10-CM | POA: Diagnosis present

## 2017-05-10 DIAGNOSIS — J9601 Acute respiratory failure with hypoxia: Secondary | ICD-10-CM

## 2017-05-10 DIAGNOSIS — Z888 Allergy status to other drugs, medicaments and biological substances status: Secondary | ICD-10-CM | POA: Diagnosis not present

## 2017-05-10 DIAGNOSIS — Z9049 Acquired absence of other specified parts of digestive tract: Secondary | ICD-10-CM | POA: Diagnosis not present

## 2017-05-10 DIAGNOSIS — Z7982 Long term (current) use of aspirin: Secondary | ICD-10-CM | POA: Diagnosis not present

## 2017-05-10 DIAGNOSIS — E876 Hypokalemia: Secondary | ICD-10-CM | POA: Diagnosis present

## 2017-05-10 DIAGNOSIS — Z79899 Other long term (current) drug therapy: Secondary | ICD-10-CM | POA: Diagnosis not present

## 2017-05-10 DIAGNOSIS — Z801 Family history of malignant neoplasm of trachea, bronchus and lung: Secondary | ICD-10-CM | POA: Diagnosis not present

## 2017-05-10 DIAGNOSIS — I5021 Acute systolic (congestive) heart failure: Secondary | ICD-10-CM | POA: Diagnosis not present

## 2017-05-10 DIAGNOSIS — Z800 Family history of malignant neoplasm of digestive organs: Secondary | ICD-10-CM | POA: Diagnosis not present

## 2017-05-10 DIAGNOSIS — Z23 Encounter for immunization: Secondary | ICD-10-CM | POA: Diagnosis not present

## 2017-05-10 LAB — BASIC METABOLIC PANEL
Anion gap: 14 (ref 5–15)
BUN: 13 mg/dL (ref 6–20)
CO2: 33 mmol/L — ABNORMAL HIGH (ref 22–32)
Calcium: 9 mg/dL (ref 8.9–10.3)
Chloride: 95 mmol/L — ABNORMAL LOW (ref 101–111)
Creatinine, Ser: 0.66 mg/dL (ref 0.44–1.00)
GFR calc Af Amer: >60 mL/min (ref >60)
Glucose, Bld: 133 mg/dL — ABNORMAL HIGH (ref 65–99)
POTASSIUM: 3.4 mmol/L — AB (ref 3.5–5.1)
SODIUM: 142 mmol/L (ref 135–145)

## 2017-05-10 LAB — ECHOCARDIOGRAM COMPLETE
Height: 66 in
WEIGHTICAEL: 3481.5 [oz_av]

## 2017-05-10 MED ORDER — CARVEDILOL 3.125 MG PO TABS
3.1250 mg | ORAL_TABLET | Freq: Two times a day (BID) | ORAL | Status: DC
  Administered 2017-05-10 – 2017-05-11 (×3): 3.125 mg via ORAL
  Filled 2017-05-10 (×4): qty 1

## 2017-05-10 MED ORDER — METHYLPREDNISOLONE SODIUM SUCC 125 MG IJ SOLR
60.0000 mg | Freq: Four times a day (QID) | INTRAMUSCULAR | Status: DC
  Administered 2017-05-10 – 2017-05-12 (×8): 60 mg via INTRAVENOUS
  Filled 2017-05-10 (×8): qty 2

## 2017-05-10 MED ORDER — DOXYCYCLINE HYCLATE 100 MG PO TABS
100.0000 mg | ORAL_TABLET | Freq: Two times a day (BID) | ORAL | Status: DC
  Administered 2017-05-10 – 2017-05-12 (×5): 100 mg via ORAL
  Filled 2017-05-10 (×5): qty 1

## 2017-05-10 MED ORDER — POTASSIUM CHLORIDE CRYS ER 20 MEQ PO TBCR
40.0000 meq | EXTENDED_RELEASE_TABLET | Freq: Two times a day (BID) | ORAL | Status: DC
  Administered 2017-05-10 – 2017-05-12 (×5): 40 meq via ORAL
  Filled 2017-05-10 (×5): qty 2

## 2017-05-10 MED ORDER — IPRATROPIUM-ALBUTEROL 0.5-2.5 (3) MG/3ML IN SOLN
3.0000 mL | RESPIRATORY_TRACT | Status: DC
  Administered 2017-05-10 – 2017-05-12 (×11): 3 mL via RESPIRATORY_TRACT
  Filled 2017-05-10 (×10): qty 3

## 2017-05-10 MED ORDER — FUROSEMIDE 10 MG/ML IJ SOLN
40.0000 mg | Freq: Two times a day (BID) | INTRAMUSCULAR | Status: AC
  Administered 2017-05-10 – 2017-05-11 (×3): 40 mg via INTRAVENOUS
  Filled 2017-05-10 (×3): qty 4

## 2017-05-10 MED ORDER — NICOTINE 21 MG/24HR TD PT24
21.0000 mg | MEDICATED_PATCH | Freq: Every day | TRANSDERMAL | Status: DC
  Administered 2017-05-10 – 2017-05-12 (×3): 21 mg via TRANSDERMAL
  Filled 2017-05-10 (×3): qty 1

## 2017-05-10 MED ORDER — METOPROLOL TARTRATE 5 MG/5ML IV SOLN
5.0000 mg | INTRAVENOUS | Status: DC | PRN
  Administered 2017-05-10: 5 mg via INTRAVENOUS
  Filled 2017-05-10: qty 5

## 2017-05-10 NOTE — Progress Notes (Signed)
*  PRELIMINARY RESULTS* Echocardiogram 2D Echocardiogram has been performed.  Leavy Cella 05/10/2017, 9:55 AM

## 2017-05-10 NOTE — Progress Notes (Addendum)
PROGRESS NOTE    [name]  TDD:[id number]  DOB: 10/23/1960  DOA: 05/09/2017 PCP: Patient, No Pcp Per   Brief Admission Hx:  HPI: [name] is a 57 y.o. female with a history of EtOH presents with SOB that started 2 days ago.  Symptoms are worsening.  Worse with exertion and improves with rest.  She is a tobacco user and reportedly smokes 2 cigarettes on average, although nursing notes report a pack a day.  Came to the emergency department due to feeling like she could not keep to catch her breath.  MDM/Assessment & Plan:   1. Acute respiratory failure with hypoxia -secondary to COPD exacerbation and superimposed congestive heart failure.  Patient is being admitted for IV diuresis with Lasix.  The COPD is being treated aggressively with IV steroids, antibiotics, continuous around-the-clock neb treatments and oxygen supplement as needed.  Repeat portable chest x-ray in the morning.. 2. CHF exacerbation-acute systolic- echocardiogram pending.  Continue diuresis.  IV Lasix ordered today for stronger diuresis response.  Monitor daily weights, intake and output. 3. Essential hypertension-patient was started on blood pressure lowering medications during admission, will follow and adjust as appropriate. 4. Hypokalemia-oral replacement ordered.  Check magnesium in the morning.  DVT prophylaxis: Lovenox Code Status: Full Family Communication: Patient Disposition Plan: This patient needs ongoing acute inpatient medical care for diuresis and treatment of COPD exacerbation.  Subjective: Patient reports that she continues to have coughing, wheezing and shortness of breath.  She has had some improvement since admission.  She says that she diuresed initially very strongly in the ED but now her diuresis has slowed.  Objective: Vitals:   05/09/17 2315 05/10/17 0432 05/10/17 0745 05/10/17 1137  BP:  (!) 175/75    Pulse:  92    Resp:  (!) 25    Temp:  98.2 F (36.8 C)    TempSrc:  Oral      SpO2: 96% 94% 97% 97%  Weight:  98.7 kg (217 lb 9.5 oz)    Height:        Intake/Output Summary (Last 24 hours) at 05/10/2017 1207 Last data filed at 05/10/2017 0900 Gross per 24 hour  Intake 240 ml  Output 300 ml  Net -60 ml   Filed Weights   05/09/17 1455 05/09/17 2002 05/10/17 0432  Weight: 81.6 kg (180 lb) 99.1 kg (218 lb 7.6 oz) 98.7 kg (217 lb 9.5 oz)     REVIEW OF SYSTEMS  As per history otherwise all reviewed and reported negative  Exam:  General exam: Awake, alert, no distress, cooperative, pleasant. Respiratory system: Diffuse inspiratory/expiratory wheezes and occasional bibasilar crackles.  No increased work of breathing. Cardiovascular system: S1 & S2 heard.  Bilateral pedal edema nonpitting. gastrointestinal system: Abdomen is nondistended, soft and nontender. Normal bowel sounds heard. Central nervous system: Alert and oriented. No focal neurological deficits. Extremities: Bilateral pretibial pitting edema.  Data Reviewed: Basic Metabolic Panel: Recent Labs  Lab 05/09/17 1523 05/10/17 0551  NA 138 142  K 3.6 3.4*  CL 94* 95*  CO2 28 33*  GLUCOSE 104* 133*  BUN 10 13  CREATININE 0.61 0.66  CALCIUM 9.2 9.0   Liver Function Tests: Recent Labs  Lab 05/09/17 1523  AST 21  ALT 17  ALKPHOS 109  BILITOT 2.0*  PROT 7.3  ALBUMIN 3.8   No results for input(s): LIPASE, AMYLASE in the last 168 hours. No results for input(s): AMMONIA in the last 168 hours. CBC: Recent Labs  Lab  05/09/17 1523  WBC 14.3*  NEUTROABS 11.1*  HGB 17.0*  HCT 53.4*  MCV 102.9*  PLT 158   Cardiac Enzymes: Recent Labs  Lab 05/09/17 1523 05/09/17 1843  TROPONINI 0.03* 0.03*   CBG (last 3)  No results for input(s): GLUCAP in the last 72 hours. No results found for this or any previous visit (from the past 240 hour(s)).   Studies: Dg Chest Port 1 View  Result Date: 05/09/2017 CLINICAL DATA:  COUGH, Patient reports difficulty breathing for the past 2 years.  Patient states her breathing became much worse last night, has felt like "there's no air in the room." Patient reports non productive coughHISTORY OF ANXIETY, ETOH ABUSE EXAM: PORTABLE CHEST 1 VIEW COMPARISON:  01/05/2010 FINDINGS: Cardiac silhouette is mildly enlarged. No mediastinal or hilar masses. No evidence of adenopathy. Clear lungs. No pleural effusion.  No pneumothorax. Skeletal structures are grossly intact. IMPRESSION: No acute cardiopulmonary disease. Electronically Signed   By: Lajean Manes M.D.   On: 05/09/2017 16:14   Scheduled Meds: . doxycycline  100 mg Oral Q12H  . enoxaparin (LOVENOX) injection  40 mg Subcutaneous Q24H  . furosemide  40 mg Intravenous BID  . ipratropium-albuterol  3 mL Nebulization QID  . losartan  25 mg Oral Daily  . methylPREDNISolone (SOLU-MEDROL) injection  60 mg Intravenous Q6H  . nicotine  21 mg Transdermal Daily  . potassium chloride  40 mEq Oral BID  . sodium chloride flush  3 mL Intravenous Q12H   Continuous Infusions: . sodium chloride      Principal Problem:   Acute respiratory failure with hypoxia (HCC) Active Problems:   COPD with acute exacerbation (HCC)   Essential hypertension   Acute systolic CHF (congestive heart failure) ([place])  Time spent:   Irwin Brakeman, MD, FAAFP [facility] Pager [phone]  If 7PM-7AM, please contact night-coverage [email or web address] Password [email or web address] 05/10/2017, 12:07 PM    LOS: 0 days

## 2017-05-11 ENCOUNTER — Inpatient Hospital Stay (HOSPITAL_COMMUNITY): Payer: BLUE CROSS/BLUE SHIELD

## 2017-05-11 LAB — HIV ANTIBODY (ROUTINE TESTING W REFLEX): HIV SCREEN 4TH GENERATION: NONREACTIVE

## 2017-05-11 LAB — CBC
HEMATOCRIT: 53.4 % — AB (ref 36.0–46.0)
HEMOGLOBIN: 16.7 g/dL — AB (ref 12.0–15.0)
MCH: 32.4 pg (ref 26.0–34.0)
MCHC: 31.3 g/dL (ref 30.0–36.0)
MCV: 103.7 fL — AB (ref 78.0–100.0)
Platelets: 209 10*3/uL (ref 150–400)
RBC: 5.15 MIL/uL — ABNORMAL HIGH (ref 3.87–5.11)
RDW: 13.1 % (ref 11.5–15.5)
WBC: 13 10*3/uL — ABNORMAL HIGH (ref 4.0–10.5)

## 2017-05-11 LAB — BASIC METABOLIC PANEL
Anion gap: 13 (ref 5–15)
BUN: 22 mg/dL — AB (ref 6–20)
CHLORIDE: 94 mmol/L — AB (ref 101–111)
CO2: 32 mmol/L (ref 22–32)
Calcium: 9.5 mg/dL (ref 8.9–10.3)
Creatinine, Ser: 0.65 mg/dL (ref 0.44–1.00)
GFR calc Af Amer: >60 mL/min (ref >60)
Glucose, Bld: 160 mg/dL — ABNORMAL HIGH (ref 65–99)
POTASSIUM: 3.8 mmol/L (ref 3.5–5.1)
Sodium: 139 mmol/L (ref 135–145)

## 2017-05-11 LAB — MAGNESIUM: Magnesium: 1.5 mg/dL — ABNORMAL LOW (ref 1.7–2.4)

## 2017-05-11 MED ORDER — CARVEDILOL 3.125 MG PO TABS
6.2500 mg | ORAL_TABLET | Freq: Two times a day (BID) | ORAL | Status: DC
  Administered 2017-05-12: 6.25 mg via ORAL
  Filled 2017-05-11: qty 2

## 2017-05-11 MED ORDER — TRAMADOL HCL 50 MG PO TABS
50.0000 mg | ORAL_TABLET | Freq: Two times a day (BID) | ORAL | Status: DC | PRN
  Administered 2017-05-11: 50 mg via ORAL
  Filled 2017-05-11: qty 1

## 2017-05-11 MED ORDER — FUROSEMIDE 40 MG PO TABS
40.0000 mg | ORAL_TABLET | Freq: Every day | ORAL | Status: DC
  Administered 2017-05-12: 40 mg via ORAL
  Filled 2017-05-11: qty 1

## 2017-05-11 MED ORDER — LOSARTAN POTASSIUM 50 MG PO TABS
50.0000 mg | ORAL_TABLET | Freq: Every day | ORAL | Status: DC
  Administered 2017-05-12: 50 mg via ORAL
  Filled 2017-05-11: qty 1

## 2017-05-11 MED ORDER — MAGNESIUM SULFATE 4 GM/100ML IV SOLN
4.0000 g | Freq: Once | INTRAVENOUS | Status: AC
  Administered 2017-05-11: 4 g via INTRAVENOUS
  Filled 2017-05-11: qty 100

## 2017-05-11 MED ORDER — HYDRALAZINE HCL 20 MG/ML IJ SOLN
10.0000 mg | INTRAMUSCULAR | Status: DC | PRN
  Administered 2017-05-11: 10 mg via INTRAVENOUS
  Filled 2017-05-11: qty 1

## 2017-05-11 MED ORDER — METOPROLOL TARTRATE 5 MG/5ML IV SOLN: 10.0000 mg | INTRAVENOUS | Status: DC | PRN

## 2017-05-11 MED ORDER — GUAIFENESIN-DM 100-10 MG/5ML PO SYRP
5.0000 mL | ORAL_SOLUTION | ORAL | Status: DC | PRN
  Administered 2017-05-11: 5 mL via ORAL
  Filled 2017-05-11: qty 5

## 2017-05-11 NOTE — Progress Notes (Signed)
PROGRESS NOTE  DIAMON REDDINGER  PFX:[id number]  DOB: Jan 31, 1960  DOA: 05/09/2017 PCP: Patient, No Pcp Per  Brief Admission Hx:  HPI: KATANA BERTHOLD is a 57 y.o. female with a history of EtOH presents with SOB that started 2 days ago.  Symptoms are worsening.  Worse with exertion and improves with rest.  She is a tobacco user and reportedly smokes 2 cigarettes on average, although nursing notes report a pack a day.  Came to the emergency department due to feeling like she could not keep to catch her breath.  MDM/Assessment & Plan:   1. Acute respiratory failure with hypoxia -secondary to COPD exacerbation and superimposed congestive heart failure.  Patient is being admitted for IV diuresis with Lasix.  The COPD is being treated aggressively with IV steroids, antibiotics, continuous around-the-clock neb treatments and oxygen supplement as needed.  Pt says that she is having more productive cough.   2. CHF exacerbation-acute systolic- echocardiogram pending.  Continue diuresis.  IV Lasix ordered today for stronger diuresis response.  Monitor daily weights, intake and output. 3. Essential hypertension-patient was started on blood pressure lowering medications during admission, will follow and adjust as appropriate. 4. Hypokalemia-oral replacement ordered.  Check magnesium in the morning.  DVT prophylaxis: Lovenox Code Status: Full Family Communication: Patient Disposition Plan: This patient needs ongoing acute inpatient medical care for diuresis and treatment of COPD exacerbation.  Subjective: Patient reports that she continues to have coughing, wheezing and shortness of breath.  She has had some improvement since admission.  She says that she diuresed initially very strongly in the ED but now her diuresis has slowed.  Objective: Vitals:   05/11/17 0405 05/11/17 0410 05/11/17 0500 05/11/17 0756  BP:  (!) 174/97    Pulse:  79    Resp:  (!) 28    Temp:  97.8 F (36.6 C)    TempSrc:  Oral      SpO2: 94% 98%  96%  Weight:   99.2 kg (218 lb 11.1 oz)   Height:        Intake/Output Summary (Last 24 hours) at 05/11/2017 1112 Last data filed at 05/11/2017 0700 Gross per 24 hour  Intake 720 ml  Output 1900 ml  Net -1180 ml   Filed Weights   05/09/17 2002 05/10/17 0432 05/11/17 0500  Weight: 99.1 kg (218 lb 7.6 oz) 98.7 kg (217 lb 9.5 oz) 99.2 kg (218 lb 11.1 oz)     REVIEW OF SYSTEMS  As per history otherwise all reviewed and reported negative  Exam:  General exam: Awake, alert, no distress, cooperative, pleasant. Respiratory system: Diffuse inspiratory/expiratory wheezes and occasional bibasilar crackles.  No increased work of breathing. Cardiovascular system: S1 & S2 heard.  Bilateral pedal edema nonpitting. gastrointestinal system: Abdomen is nondistended, soft and nontender. Normal bowel sounds heard. Central nervous system: Alert and oriented. No focal neurological deficits. Extremities: Bilateral pretibial pitting edema.  Data Reviewed: Basic Metabolic Panel: Recent Labs  Lab 05/09/17 1523 05/10/17 0551 05/11/17 0620  NA 138 142 139  K 3.6 3.4* 3.8  CL 94* 95* 94*  CO2 28 33* 32  GLUCOSE 104* 133* 160*  BUN 10 13 22*  CREATININE 0.61 0.66 0.65  CALCIUM 9.2 9.0 9.5  MG  --   --  1.5*   Liver Function Tests: Recent Labs  Lab 05/09/17 1523  AST 21  ALT 17  ALKPHOS 109  BILITOT 2.0*  PROT 7.3  ALBUMIN 3.8   No results for input(s):  LIPASE, AMYLASE in the last 168 hours. No results for input(s): AMMONIA in the last 168 hours. CBC: Recent Labs  Lab 05/09/17 1523 05/11/17 0620  WBC 14.3* 13.0*  NEUTROABS 11.1*  --   HGB 17.0* 16.7*  HCT 53.4* 53.4*  MCV 102.9* 103.7*  PLT 158 209   Cardiac Enzymes: Recent Labs  Lab 05/09/17 1523 05/09/17 1843  TROPONINI 0.03* 0.03*   CBG (last 3)  No results for input(s): GLUCAP in the last 72 hours. No results found for this or any previous visit (from the past 240 hour(s)).   Studies: Dg Chest  Port 1 View  Result Date: 05/11/2017 CLINICAL DATA:  Acute respiratory failure with hypoxia. EXAM: PORTABLE CHEST 1 VIEW COMPARISON:  Radiograph of May 09, 2017. FINDINGS: Stable cardiomegaly with central pulmonary vascular congestion. No pneumothorax or pleural effusion is noted. No consolidative process is noted. Bony thorax is unremarkable. IMPRESSION: Stable cardiomegaly with probable mild central pulmonary vascular congestion. Electronically Signed   By: Marijo Conception, M.D.   On: 05/11/2017 07:27   Dg Chest Port 1 View  Result Date: 05/09/2017 CLINICAL DATA:  COUGH, Patient reports difficulty breathing for the past 2 years. Patient states her breathing became much worse last night, has felt like "there's no air in the room." Patient reports non productive coughHISTORY OF ANXIETY, ETOH ABUSE EXAM: PORTABLE CHEST 1 VIEW COMPARISON:  01/05/2010 FINDINGS: Cardiac silhouette is mildly enlarged. No mediastinal or hilar masses. No evidence of adenopathy. Clear lungs. No pleural effusion.  No pneumothorax. Skeletal structures are grossly intact. IMPRESSION: No acute cardiopulmonary disease. Electronically Signed   By: Lajean Manes M.D.   On: 05/09/2017 16:14   Scheduled Meds: . carvedilol  3.125 mg Oral BID WC  . doxycycline  100 mg Oral Q12H  . enoxaparin (LOVENOX) injection  40 mg Subcutaneous Q24H  . furosemide  40 mg Intravenous BID  . [START ON 05/12/2017] furosemide  40 mg Oral Daily  . ipratropium-albuterol  3 mL Nebulization Q4H  . losartan  25 mg Oral Daily  . methylPREDNISolone (SOLU-MEDROL) injection  60 mg Intravenous Q6H  . nicotine  21 mg Transdermal Daily  . potassium chloride  40 mEq Oral BID  . sodium chloride flush  3 mL Intravenous Q12H   Continuous Infusions: . sodium chloride    . magnesium sulfate 1 - 4 g bolus IVPB      Principal Problem:   Acute respiratory failure with hypoxia (HCC) Active Problems:   COPD with acute exacerbation (HCC)   Essential  hypertension   Acute systolic CHF (congestive heart failure) ([place])  Time spent:   Irwin Brakeman, MD, FAAFP [facility] Pager [phone]  If 7PM-7AM, please contact night-coverage [email or web address] Password [email or web address] 05/11/2017, 11:12 AM    LOS: 1 day

## 2017-05-11 NOTE — Progress Notes (Signed)
Tele called. Pt had 5 beats unsustained Vtach. Pt asymptomatic. Bodenheimer, MD paged.

## 2017-05-11 NOTE — Plan of Care (Signed)
progressing 

## 2017-05-11 NOTE — Care Management (Addendum)
Pt from home, ind with ADL's. Pt has insurance, not employed, has no PCP. Says she does not seek medical care unless it is in the ED because she can not afford to. Pt has a 8K deductible, has to pay OOP for all medications, DME and $100/PCP visit until that deductible is met. Pt is on oxygen acutely. She has no neb machine or other DME pta. As of today pt's account balance for this admission is $8,394.40. If this is correct all future medical costs this year will cost pt $0. CM made visit with pt this afternoon to discuss benefits check, pt sleepy and hard to carry on conversation.   Benefits Check:      Pt. has a 7900.00 deductible which has not been met,once the deductible has been met it will be covered '@100' %.  for all services.  Per Dorian Pod W/[employer]/ ref# for this call was [phone]

## 2017-05-12 MED ORDER — CARVEDILOL 6.25 MG PO TABS: 6.2500 mg | ORAL_TABLET | Freq: Two times a day (BID) | ORAL | 0 refills | Status: DC

## 2017-05-12 MED ORDER — FUROSEMIDE 40 MG PO TABS: 40.0000 mg | ORAL_TABLET | Freq: Every day | ORAL | 0 refills | Status: AC

## 2017-05-12 MED ORDER — DOXYCYCLINE HYCLATE 100 MG PO TABS: 100.0000 mg | ORAL_TABLET | Freq: Two times a day (BID) | ORAL | 0 refills | Status: AC

## 2017-05-12 MED ORDER — IPRATROPIUM-ALBUTEROL 0.5-2.5 (3) MG/3ML IN SOLN: 3.0000 mL | RESPIRATORY_TRACT | 0 refills | Status: AC | PRN

## 2017-05-12 MED ORDER — POTASSIUM CHLORIDE CRYS ER 20 MEQ PO TBCR: 40.0000 meq | EXTENDED_RELEASE_TABLET | Freq: Every day | ORAL | 0 refills | Status: AC

## 2017-05-12 MED ORDER — ALBUTEROL SULFATE HFA 108 (90 BASE) MCG/ACT IN AERS: 2.0000 | INHALATION_SPRAY | RESPIRATORY_TRACT | 1 refills | Status: AC | PRN

## 2017-05-12 MED ORDER — LOSARTAN POTASSIUM 50 MG PO TABS: 50.0000 mg | ORAL_TABLET | Freq: Every day | ORAL | 0 refills | Status: DC

## 2017-05-12 MED ORDER — PREDNISONE 20 MG PO TABS: ORAL_TABLET | ORAL | 0 refills | Status: DC

## 2017-05-12 NOTE — Care Management Note (Signed)
Case Management Note  Patient Details  Name: KRISTELL WOODING MRN: [id number] Date of Birth: October 26, 1960     Expected Discharge Date:  05/12/17               Expected Discharge Plan:  Home/Self Care  In-House Referral:     Discharge planning Services  CM Consult  Post Acute Care Choice:  Durable Medical Equipment Choice offered to:  Patient  DME Arranged:  Oxygen, Nebulizer machine DME Agency:  [place]:    [facility] Agency:     Status of Service:  Completed, signed off  If discussed at [place] of Stay Meetings, dates discussed:    Additional Comments: Patient discharging today. Qualifies for home oxygen. Will also need a nebulizer machine. [name] of [facility] notified and obtained orders and followed up with patient regarding price/insurance billing. Patient agreeable. Oxygen and neb machine will be delivered to room prior to DC. Patient provided with  PCP list and encouraged to call and establish care.   Dorella Laster, Chauncey Reading, RN 05/12/2017, 1:19 PM

## 2017-05-12 NOTE — Progress Notes (Signed)
Dr. Roma Kayser office not accepting new patients at this time.  Dr. Wynetta Emery notified via text page.

## 2017-05-12 NOTE — Progress Notes (Signed)
Patient's O2 sats at rest 88% on RA.  Patient placed back on O2 at 3 liters.  Case manager in room to see patient and notified.  Case manager discussed with patient that insurance will not cover her oxygen.

## 2017-05-12 NOTE — Discharge Instructions (Signed)
Please establish care with primary care provider as soon as possible to have your blood pressure rechecked and your medications adjusted and renewed.  Please follow up with a cardiologist to establish care in 2 weeks.   Follow with Primary MD  Patient, No Pcp Per  and other consultants as instructed your Hospitalist MD  Please get a complete blood count and chemistry panel checked by your Primary MD at your next visit, and again as instructed by your Primary MD.  Get Medicines reviewed and adjusted: Please take all your medications with you for your next visit with your Primary MD  Laboratory/radiological data: Please request your Primary MD to go over all hospital tests and procedure/radiological results at the follow up, please ask your Primary MD to get all Hospital records sent to his/her office.  In some cases, they will be blood work, cultures and biopsy results pending at the time of your discharge. Please request that your primary care M.D. follows up on these results.  Also Note the following: If you experience worsening of your admission symptoms, develop shortness of breath, life threatening emergency, suicidal or homicidal thoughts you must seek medical attention immediately by calling 911 or calling your MD immediately  if symptoms less severe.  You must read complete instructions/literature along with all the possible adverse reactions/side effects for all the Medicines you take and that have been prescribed to you. Take any new Medicines after you have completely understood and accpet all the possible adverse reactions/side effects.   Do not drive when taking Pain medications or sleeping medications (Benzodaizepines)  Do not take more than prescribed Pain, Sleep and Anxiety Medications. It is not advisable to combine anxiety,sleep and pain medications without talking with your primary care practitioner  Special Instructions: If you have smoked or chewed Tobacco  in the last 2  yrs please stop smoking, stop any regular Alcohol  and or any Recreational drug use.  Wear Seat belts while driving.  Please note: You were cared for by a hospitalist during your hospital stay. Once you are discharged, your primary care physician will handle any further medical issues. Please note that NO REFILLS for any discharge medications will be authorized once you are discharged, as it is imperative that you return to your primary care physician (or establish a relationship with a primary care physician if you do not have one) for your post hospital discharge needs so that they can reassess your need for medications and monitor your lab values.    Chronic Obstructive Pulmonary Disease Exacerbation Chronic obstructive pulmonary disease (COPD) is a common lung problem. In COPD, the flow of air from the lungs is limited. COPD exacerbations are times that breathing gets worse and you need extra treatment. Without treatment they can be life threatening. If they happen often, your lungs can become more damaged. If your COPD gets worse, your doctor may treat you with:  Medicines.  Oxygen.  Different ways to clear your airway, such as using a mask.  Follow these instructions at home:  Do not smoke.  Avoid tobacco smoke and other things that bother your lungs.  If given, take your antibiotic medicine as told. Finish the medicine even if you start to feel better.  Only take medicines as told by your doctor.  Drink enough fluids to keep your pee (urine) clear or pale yellow (unless your doctor has told you not to).  Use a cool mist machine (vaporizer).  If you use oxygen or a machine that  turns liquid medicine into a mist (nebulizer), continue to use them as told.  Keep up with shots (vaccinations) as told by your doctor.  Exercise regularly.  Eat healthy foods.  Keep all doctor visits as told. Get help right away if:  You are very short of breath and it gets worse.  You have  trouble talking.  You have bad chest pain.  You have blood in your spit (sputum).  You have a fever.  You keep throwing up (vomiting).  You feel weak, or you pass out (faint).  You feel confused.  You keep getting worse. This information is not intended to replace advice given to you by your health care provider. Make sure you discuss any questions you have with your health care provider. Document Released: 12/19/2010 Document Revised: 06/07/2015 Document Reviewed: 09/03/2012 Elsevier Interactive Patient Education  2017 [employer] DASH stands for "Dietary Approaches to Stop Hypertension." The DASH eating plan is a healthy eating plan that has been shown to reduce high blood pressure (hypertension). It may also reduce your risk for type 2 diabetes, heart disease, and stroke. The DASH eating plan may also help with weight loss. What are tips for following this plan? General guidelines  Avoid eating more than 2,300 mg (milligrams) of salt (sodium) a day. If you have hypertension, you may need to reduce your sodium intake to 1,500 mg a day.  Limit alcohol intake to no more than 1 drink a day for nonpregnant women and 2 drinks a day for men. One drink equals 12 oz of beer, 5 oz of wine, or 1 oz of hard liquor.  Work with your health care provider to maintain a healthy body weight or to lose weight. Ask what an ideal weight is for you.  Get at least 30 minutes of exercise that causes your heart to beat faster (aerobic exercise) most days of the week. Activities may include walking, swimming, or biking.  Work with your health care provider or diet and nutrition specialist (dietitian) to adjust your eating plan to your individual calorie needs. Reading food labels  Check food labels for the amount of sodium per serving. Choose foods with less than 5 percent of the Daily Value of sodium. Generally, foods with less than 300 mg of sodium per serving fit into this  eating plan.  To find whole grains, look for the word "whole" as the first word in the ingredient list. Shopping  Buy products labeled as "low-sodium" or "no salt added."  Buy fresh foods. Avoid canned foods and premade or frozen meals. Cooking  Avoid adding salt when cooking. Use salt-free seasonings or herbs instead of table salt or sea salt. Check with your health care provider or pharmacist before using salt substitutes.  Do not fry foods. Cook foods using healthy methods such as baking, boiling, grilling, and broiling instead.  Cook with heart-healthy oils, such as olive, canola, soybean, or sunflower oil. Meal planning   Eat a balanced diet that includes: ? 5 or more servings of fruits and vegetables each day. At each meal, try to fill half of your plate with fruits and vegetables. ? Up to 6-8 servings of whole grains each day. ? Less than 6 oz of lean meat, poultry, or fish each day. A 3-oz serving of meat is about the same size as a deck of cards. One egg equals 1 oz. ? 2 servings of low-fat dairy each day. ? A serving of nuts,  seeds, or beans 5 times each week. ? Heart-healthy fats. Healthy fats called Omega-3 fatty acids are found in foods such as flaxseeds and coldwater fish, like sardines, salmon, and mackerel.  Limit how much you eat of the following: ? Canned or prepackaged foods. ? Food that is high in trans fat, such as fried foods. ? Food that is high in saturated fat, such as fatty meat. ? Sweets, desserts, sugary drinks, and other foods with added sugar. ? Full-fat dairy products.  Do not salt foods before eating.  Try to eat at least 2 vegetarian meals each week.  Eat more home-cooked food and less restaurant, buffet, and fast food.  When eating at a restaurant, ask that your food be prepared with less salt or no salt, if possible. What foods are recommended? The items listed may not be a complete list. Talk with your dietitian about what dietary choices  are best for you. Grains Whole-grain or whole-wheat bread. Whole-grain or whole-wheat pasta. Brown rice. [place]. Bulgur. Whole-grain and low-sodium cereals. Pita bread. Low-fat, low-sodium crackers. Whole-wheat flour tortillas. Vegetables Fresh or frozen vegetables (raw, steamed, roasted, or grilled). Low-sodium or reduced-sodium tomato and vegetable juice. Low-sodium or reduced-sodium tomato sauce and tomato paste. Low-sodium or reduced-sodium canned vegetables. Fruits All fresh, dried, or frozen fruit. Canned fruit in natural juice (without added sugar). Meat and other protein foods Skinless chicken or [place]. Ground chicken or [place]. Pork with fat trimmed off. Fish and seafood. Egg whites. Dried beans, peas, or lentils. Unsalted nuts, nut butters, and seeds. Unsalted canned beans. Lean cuts of beef with fat trimmed off. Low-sodium, lean deli meat. Dairy Low-fat (1%) or fat-free (skim) milk. Fat-free, low-fat, or reduced-fat cheeses. Nonfat, low-sodium ricotta or cottage cheese. Low-fat or nonfat yogurt. Low-fat, low-sodium cheese. Fats and oils Soft margarine without trans fats. Vegetable oil. Low-fat, reduced-fat, or light mayonnaise and salad dressings (reduced-sodium). Canola, safflower, olive, soybean, and sunflower oils. Avocado. Seasoning and other foods Herbs. Spices. Seasoning mixes without salt. Unsalted popcorn and pretzels. Fat-free sweets. What foods are not recommended? The items listed may not be a complete list. Talk with your dietitian about what dietary choices are best for you. Grains Baked goods made with fat, such as croissants, muffins, or some breads. Dry pasta or rice meal packs. Vegetables Creamed or fried vegetables. Vegetables in a cheese sauce. Regular canned vegetables (not low-sodium or reduced-sodium). Regular canned tomato sauce and paste (not low-sodium or reduced-sodium). Regular tomato and vegetable juice (not low-sodium or reduced-sodium). Angie Fava.  Olives. Fruits Canned fruit in a light or heavy syrup. Fried fruit. Fruit in cream or butter sauce. Meat and other protein foods Fatty cuts of meat. Ribs. Fried meat. Berniece Salines. Sausage. Bologna and other processed lunch meats. Salami. Fatback. Hotdogs. Bratwurst. Salted nuts and seeds. Canned beans with added salt. Canned or smoked fish. Whole eggs or egg yolks. Chicken or [place] with skin. Dairy Whole or 2% milk, cream, and half-and-half. Whole or full-fat cream cheese. Whole-fat or sweetened yogurt. Full-fat cheese. Nondairy creamers. Whipped toppings. Processed cheese and cheese spreads. Fats and oils Butter. Stick margarine. Lard. Shortening. Ghee. Bacon fat. Tropical oils, such as coconut, palm kernel, or palm oil. Seasoning and other foods Salted popcorn and pretzels. Onion salt, garlic salt, seasoned salt, table salt, and sea salt. Worcestershire sauce. Tartar sauce. Barbecue sauce. Teriyaki sauce. Soy sauce, including reduced-sodium. Steak sauce. Canned and packaged gravies. Fish sauce. Oyster sauce. Cocktail sauce. Horseradish that you find on the shelf. Ketchup. Mustard. Meat flavorings and  tenderizers. Bouillon cubes. Hot sauce and Tabasco sauce. Premade or packaged marinades. Premade or packaged taco seasonings. Relishes. Regular salad dressings. Where to find more information:  National Heart, Lung, and [place]: https://wilson-eaton.com/  American Heart Association: www.heart.org Summary  The DASH eating plan is a healthy eating plan that has been shown to reduce high blood pressure (hypertension). It may also reduce your risk for type 2 diabetes, heart disease, and stroke.  With the DASH eating plan, you should limit salt (sodium) intake to 2,300 mg a day. If you have hypertension, you may need to reduce your sodium intake to 1,500 mg a day.  When on the DASH eating plan, aim to eat more fresh fruits and vegetables, whole grains, lean proteins, low-fat dairy, and heart-healthy  fats.  Work with your health care provider or diet and nutrition specialist (dietitian) to adjust your eating plan to your individual calorie needs. This information is not intended to replace advice given to you by your health care provider. Make sure you discuss any questions you have with your health care provider. Document Released: 12/19/2010 Document Revised: 12/24/2015 Document Reviewed: 12/24/2015 Elsevier Interactive Patient Education  2018 [employer].   COPD Action Plan Introduction A COPD action plan is a description of what to do when you have a flare (exacerbation) of chronic obstructive pulmonary disease (COPD). Your action plan is a color-coded plan that lists the symptoms that indicate whether or not your condition is under control and what actions to take.  If you have symptoms in the green zone, it means you are doing well that day.  If you have symptoms in the yellow zone, it means you are having a bad day or an exacerbation.  If you have symptoms in the red zone, you need urgent medical care.  Follow the plan you and your health care provider developed. Review your plan with your health care provider at each visit. Red zone  Symptoms in this zone mean that you should get medical help right away. They include:  Feeling very short of breath, even when you are resting.  Not being able to do any activities because of poor breathing.  Not being able to sleep because of poor breathing.  Fever or shaking chills.  Feeling confused or very sleepy.  Chest pain.  Coughing up blood.  If you have any of these symptoms, call emergency services (911 in the [place] or go to the nearest emergency room. Yellow zone  Symptoms in this zone mean that your condition may be getting worse. They include:  Feeling more short of breath than usual.  Having less energy for daily activities than usual.  Phlegm or mucus that is thicker than usual.  Needing to use your rescue  inhaler or nebulizer more often than usual.  More ankle swelling than usual.  Coughing more than usual.  Feeling like you have a chest cold.  Trouble sleeping due to COPD symptoms.  Decreased appetite.  COPD medicines not helping as much as usual.  If you experience any "yellow" symptoms:  Keep taking your daily medicines as directed.  Use your quick-relief inhaler as told by your health care provider.  If you were prescribed steroid medicine to take by mouth (oralmedicine), start taking it as told by your health care provider.  If you were prescribed an antibiotic, start taking it as told by your health care provider. Do not stop taking the antibiotic even if you start to feel better.  Use oxygen as told  by your health care provider.  Get more rest.  Do your pursed-lip breathing exercises.  Do not smoke. Avoid any irritants in the air.  If your signs and symptoms do not improve after taking these steps, call your health care provider right away. Green zone  Symptoms in this zone mean that you are doing well. They include:  Being able to do your usual activities and exercise.  Having the usual amount of coughing, including the same amount of phlegm or mucus.  Being able to sleep well.  Having a good appetite.  Follow these instructions at home:  Continue taking your daily medicines as told by your health care provider.  Make sure you receive all the immunizations that your health care provider recommends, especially the pneumococcal and influenza vaccines.  Wash your hands often with soap and water. Have family members wash their hands too. Regular hand washing can help prevent infections.  Follow your usual exercise and diet plan.  Avoid irritants in the air, such as smoke.  Do not use any products that contain nicotine or tobacco, such as cigarettes and e-cigarettes. If you need help quitting, ask your health care provider. Where to find more  information: You can find more information about COPD from:  American Lung Association, My COPD Action Plan: SlotDealers.si.pdf  COPD Foundation: www.copdfoundation.[place]: http://cline.com/  This information is not intended to replace advice given to you by your health care provider. Make sure you discuss any questions you have with your health care provider. Document Released: 05/14/2016 Document Revised: 05/14/2016 Document Reviewed: 05/14/2016 Elsevier Interactive Patient Education  2018 [employer].   How to Take Your Blood Pressure Blood pressure is a measurement of how strongly your blood is pressing against the walls of your arteries. Arteries are blood vessels that carry blood from your heart throughout your body. Your health care provider takes your blood pressure at each office visit. You can also take your own blood pressure at home with a blood pressure machine. You may need to take your own blood pressure:  To confirm a diagnosis of high blood pressure (hypertension).  To monitor your blood pressure over time.  To make sure your blood pressure medicine is working.  Supplies needed: To take your blood pressure, you will need a blood pressure machine. You can buy a blood pressure machine, or blood pressure monitor, at most drugstores or online. There are several types of home blood pressure monitors. When choosing one, consider the following:  Choose a monitor that has an arm cuff.  Choose a monitor that wraps snugly around your upper arm. You should be able to fit only one finger between your arm and the cuff.  Do not choose a monitor that measures your blood pressure from your wrist or finger.  Your health care provider can suggest a reliable monitor that will meet your needs. How to prepare To get the most accurate reading, avoid the following for 30 minutes before  you check your blood pressure:  Drinking caffeine.  Drinking alcohol.  Eating.  Smoking.  Exercising.  Five minutes before you check your blood pressure:  Empty your bladder.  Sit quietly without talking in a dining chair, rather than in a soft couch or armchair.  How to take your blood pressure To check your blood pressure, follow the instructions in the manual that came with your blood pressure monitor. If you have a digital blood pressure monitor, the instructions may be as  follows: 1. Sit up straight. 2. Place your feet on the floor. Do not cross your ankles or legs. 3. Rest your left arm at the level of your heart on a table or desk or on the arm of a chair. 4. Pull up your shirt sleeve. 5. Wrap the blood pressure cuff around the upper part of your left arm, 1 inch (2.5 cm) above your elbow. It is best to wrap the cuff around bare skin. 6. Fit the cuff snugly around your arm. You should be able to place only one finger between the cuff and your arm. 7. Position the cord inside the groove of your elbow. 8. Press the power button. 9. Sit quietly while the cuff inflates and deflates. 10. Read the digital reading on the monitor screen and write it down (record it). 11. Wait 2-3 minutes, then repeat the steps, starting at step 1.  What does my blood pressure reading mean? A blood pressure reading consists of a higher number over a lower number. Ideally, your blood pressure should be below 120/80. The first ("top") number is called the systolic pressure. It is a measure of the pressure in your arteries as your heart beats. The second ("bottom") number is called the diastolic pressure. It is a measure of the pressure in your arteries as the heart relaxes. Blood pressure is classified into four stages. The following are the stages for adults who do not have a short-term serious illness or a chronic condition. Systolic pressure and diastolic pressure are measured in a unit called mm  Hg. Normal  Systolic pressure: below 086.  Diastolic pressure: below 80. Elevated  Systolic pressure: 578-469.  Diastolic pressure: below 80. Hypertension stage 1  Systolic pressure: 629-528.  Diastolic pressure: 41-32. Hypertension stage 2  Systolic pressure: 440 or above.  Diastolic pressure: 90 or above. You can have prehypertension or hypertension even if only the systolic or only the diastolic number in your reading is higher than normal. Follow these instructions at home:  Check your blood pressure as often as recommended by your health care provider.  Take your monitor to the next appointment with your health care provider to make sure: ? That you are using it correctly. ? That it provides accurate readings.  Be sure you understand what your goal blood pressure numbers are.  Tell your health care provider if you are having any side effects from blood pressure medicine. Contact a health care provider if:  Your blood pressure is consistently high. Get help right away if:  Your systolic blood pressure is higher than 180.  Your diastolic blood pressure is higher than 110. This information is not intended to replace advice given to you by your health care provider. Make sure you discuss any questions you have with your health care provider. Document Released: 06/08/2015 Document Revised: 08/21/2015 Document Reviewed: 06/08/2015 Elsevier Interactive Patient Education  2018 [employer].   Hypertension Hypertension is another name for high blood pressure. High blood pressure forces your heart to work harder to pump blood. This can cause problems over time. There are two numbers in a blood pressure reading. There is a top number (systolic) over a bottom number (diastolic). It is best to have a blood pressure below 120/80. Healthy choices can help lower your blood pressure. You may need medicine to help lower your blood pressure if:  Your blood pressure cannot be  lowered with healthy choices.  Your blood pressure is higher than 130/80.  Follow these instructions at home:  Eating and drinking  If directed, follow the DASH eating plan. This diet includes: ? Filling half of your plate at each meal with fruits and vegetables. ? Filling one quarter of your plate at each meal with whole grains. Whole grains include whole wheat pasta, brown rice, and whole grain bread. ? Eating or drinking low-fat dairy products, such as skim milk or low-fat yogurt. ? Filling one quarter of your plate at each meal with low-fat (lean) proteins. Low-fat proteins include fish, skinless chicken, eggs, beans, and tofu. ? Avoiding fatty meat, cured and processed meat, or chicken with skin. ? Avoiding premade or processed food.  Eat less than 1,500 mg of salt (sodium) a day.  Limit alcohol use to no more than 1 drink a day for nonpregnant women and 2 drinks a day for men. One drink equals 12 oz of beer, 5 oz of wine, or 1 oz of hard liquor. Lifestyle  Work with your doctor to stay at a healthy weight or to lose weight. Ask your doctor what the best weight is for you.  Get at least 30 minutes of exercise that causes your heart to beat faster (aerobic exercise) most days of the week. This may include walking, swimming, or biking.  Get at least 30 minutes of exercise that strengthens your muscles (resistance exercise) at least 3 days a week. This may include lifting weights or pilates.  Do not use any products that contain nicotine or tobacco. This includes cigarettes and e-cigarettes. If you need help quitting, ask your doctor.  Check your blood pressure at home as told by your doctor.  Keep all follow-up visits as told by your doctor. This is important. Medicines  Take over-the-counter and prescription medicines only as told by your doctor. Follow directions carefully.  Do not skip doses of blood pressure medicine. The medicine does not work as well if you skip doses.  Skipping doses also puts you at risk for problems.  Ask your doctor about side effects or reactions to medicines that you should watch for. Contact a doctor if:  You think you are having a reaction to the medicine you are taking.  You have headaches that keep coming back (recurring).  You feel dizzy.  You have swelling in your ankles.  You have trouble with your vision. Get help right away if:  You get a very bad headache.  You start to feel confused.  You feel weak or numb.  You feel faint.  You get very bad pain in your: ? Chest. ? Belly (abdomen).  You throw up (vomit) more than once.  You have trouble breathing. Summary  Hypertension is another name for high blood pressure.  Making healthy choices can help lower blood pressure. If your blood pressure cannot be controlled with healthy choices, you may need to take medicine. This information is not intended to replace advice given to you by your health care provider. Make sure you discuss any questions you have with your health care provider. Document Released: 06/18/2007 Document Revised: 11/28/2015 Document Reviewed: 11/28/2015 Elsevier Interactive Patient Education  2018 [place] Lip Breathing Pursed lip breathing is a technique to relieve the feeling of being short of breath. Some long-term respiratory conditions, like chronic obstructive pulmonary disease (COPD) and severe asthma, can make it hard to breathe out (exhale) all of the air in your lungs. This can make air that has less oxygen than normal build up in your lungs (air trapping). Trapped air means your  lungs fill with less fresh air when you breathe in (inhale). As a result, you feel short of breath. Pursed lip breathing keeps your airways open longer when you exhale and empties more air from your lungs. This makes more space for fresh air when you inhale. Pursed lip breathing can also slow down your breathing and help your body not have to  work so hard to breathe. Over time, pursed lip breathing may help you be able to be more physically active and do more activities. How to perform pursed lip breathing Being short of breath can make you tense and anxious. Before you start this breathing exercise, take a minute to relax your shoulders and close your eyes. Then: 1. Start the exercise by closing your mouth. 2. Breathe in through your nose, taking a normal breath. You can do this at your normal rate of breathing. If you feel you are not getting enough air, breathe in while slowly counting to 2 or 3. 3. Pucker (purse) your lips as if you were going to whistle. 4. Gently tighten your abdomen muscles or press on your belly to help push the air out. 5. Breathe out slowly through your pursed lips. Take at least twice as long to breathe out as it takes you to breathe in. 6. Make sure that you breathe out all of the air, but do not force air out. 7. Repeat the exercise until your breathing improves. Ask your health care provider how often and how long to do this exercise.  Follow these instructions at home:  Take over-the-counter and prescription medicines only as told by your health care provider.  Return to your normal activities as told by your health care provider. Ask your health care provider what activities are safe for you.  Do not use any products that contain nicotine or tobacco, such as cigarettes and e-cigarettes. If you need help quitting, ask your health care provider.  Keep all follow-up visits as told by your health care provider. This is important. Contact a health care provider if:  Your shortness of breath gets worse.  You become less able to exercise or be active.  You develop a cough.  You develop a fever. Get help right away if:  You are struggling to breathe.  Your shortness of breath prevents you from engaging in any activity. Summary  Pursed lip breathing is a breathing technique that helps to remove  trapped air from your lungs. It helps you get more oxygen into your lungs and makes your body have to work less hard to breathe.  Pursed lip breathing can gradually make you more able to be physically active.  You can do pursed lip breathing on your own at home.  Ask your health care provider how often and how long you should do pursed lip breathing. This information is not intended to replace advice given to you by your health care provider. Make sure you discuss any questions you have with your health care provider. Document Released: 10/09/2007 Document Revised: 11/22/2015 Document Reviewed: 11/22/2015 Elsevier Interactive Patient Education  2017 [place] of Breath, Adult Shortness of breath is when a person has trouble breathing enough air, or when a person feels like she or he is having trouble breathing in enough air. Shortness of breath could be a sign of medical problem. Follow these instructions at home: Pay attention to any changes in your symptoms. Take these actions to help with your condition:  Do not smoke. Smoking is  a common cause of shortness of breath. If you smoke and you need help quitting, ask your health care provider.  Avoid things that can irritate your airways, such as: ? Mold. ? Dust. ? Air pollution. ? Chemical fumes. ? Things that can cause allergy symptoms (allergens), if you have allergies.  Keep your living space clean and free of mold and dust.  Rest as needed. Slowly return to your usual activities.  Take over-the-counter and prescription medicines, including oxygen and inhaled medicines, only as told by your health care provider.  Keep all follow-up visits as told by your health care provider. This is important.  Contact a health care provider if:  Your condition does not improve as soon as expected.  You have a hard time doing your normal activities, even after you rest.  You have new symptoms. Get help right away  if:  Your shortness of breath gets worse.  You have shortness of breath when you are resting.  You feel light-headed or you faint.  You have a cough that is not controlled with medicines.  You cough up blood.  You have pain with breathing.  You have pain in your chest, arms, shoulders, or abdomen.  You have a fever.  You cannot walk up stairs or exercise the way that you normally do. This information is not intended to replace advice given to you by your health care provider. Make sure you discuss any questions you have with your health care provider. Document Released: 09/24/2000 Document Revised: 07/21/2015 Document Reviewed: 06/07/2015 Elsevier Interactive Patient Education  2018 [place] Oxygen Use, Adult When a medical condition keeps you from getting enough oxygen, your health care provider may instruct you to take extra oxygen at home. Your health care provider will let you know:  When to take oxygen.  For how long to take oxygen.  How quickly oxygen should be delivered (flow rate), in liters per minute (LPM or L/M).  Home oxygen can be given through:  A mask.  A nasal cannula. This is a device or tube that goes in the nostrils.  A transtracheal catheter. This is a small, flexible tube placed in the trachea.  A tracheostomy. This is a surgically made opening in the trachea.  These devices are connected with tubing to an oxygen source, such as:  A tank. Tanks hold oxygen in gas form. They must be replaced when the oxygen is used up.  A liquid oxygen device. This holds oxygen in liquid form. It must be replaced when the oxygen is used up.  An oxygen concentrator machine. This filters oxygen in the room. It uses electricity, so you must have a backup cylinder of oxygen in case the power goes out.  Supplies needed: To use oxygen, you will need:  A mask, nasal cannula, transtracheal catheter, or tracheostomy.  An oxygen tank, a liquid oxygen  device, or an oxygen concentrator.  The tape that your health care provider recommends (optional).  If you use a transtracheal catheter and your prescribed flow rate is 1 LPM or greater, you will also need a humidifier. Risks and complications  Fire. This can happen if the oxygen is exposed to a heat source, flame, or spark.  Injury to skin. This can happen if liquid oxygen touches your skin.  Organ damage. This can happen if you get too little oxygen. How to use oxygen Your health care provider will show you how to use your oxygen device. Follow her or his instructions.  They may look something like this: 1. Wash your hands. 2. If you use an oxygen concentrator, make sure it is plugged in. 3. Place one end of the tube into the port on the tank, device, or machine. 4. Place the mask over your nose and mouth. Or, place the nasal cannula and secure it with tape if instructed. If you use a tracheostomy or transtracheal catheter, connect it to the oxygen source as directed. 5. Make sure the liter-flow setting on the machine is at the level prescribed by your health care provider. 6. Turn on the machine or adjust the knob on the tank or device to the correct liter-flow setting. 7. When you are done, turn off and unplug the machine, or turn the knob to OFF.  How to clean and care for the oxygen supplies Nasal cannula  Clean it with a warm, wet cloth daily or as needed.  Wash it with a liquid soap once a week.  Rinse it thoroughly once or twice a week.  Replace it every 2-4 weeks.  If you have an infection, such as a cold or pneumonia, change the cannula when you get better. Mask  Replace it every 2-4 weeks.  If you have an infection, such as a cold or pneumonia, change the mask when you get better. Humidifier bottle  Wash the bottle between each refill: ? Wash it with soap and warm water. ? Rinse it thoroughly. ? Disinfect it and its top. ? Air-dry it.  Make sure it is dry  before you refill it. Oxygen concentrator  Clean the air filter at least twice a week according to directions from your home medical equipment and service company.  Wipe down the cabinet every day. To do this: ? Unplug the unit. ? Wipe down the cabinet with a damp cloth. ? Dry the cabinet. Other equipment  Change any extra tubing every 1-3 months.  Follow instructions from your health care provider about taking care of any other equipment. Safety tips Fire safety tips   Keep your oxygen and oxygen supplies at least 5 ft away from sources of heat, flames, and sparks at all times.  Do not allow smoking near your oxygen. Put up "no smoking" signs in your home.  Do not use materials that can burn (are flammable) while you use oxygen.  When you go to a restaurant with portable oxygen, ask to be seated in the nonsmoking section.  Keep a Data processing manager close by. Let your fire department know that you have oxygen in your home.  Test your home smoke detectors regularly. General safety tips  If you use an oxygen cylinder, make sure it is in a stand or secured to an object that will not move (fixed object).  If you use liquid oxygen, make sure its container is kept upright.  If you use an oxygen concentrator: ? [employer] movement psychotherapist company. Make sure you are given priority service in the event that your power goes out. ? Avoid using extension cords, if possible. Follow these instructions at home:  Use oxygen only as told by your health care provider.  Do not use alcohol or other drugs that make you relax (sedating drugs) unless instructed. They can slow down your breathing rate and make it hard to get in enough oxygen.  Know how and when to order a refill of oxygen.  Always keep a spare tank of oxygen. Plan ahead for holidays when you may not be able to get a prescription filled.  Use water-based lubricants on your lips or nostrils. Do not use oil-based products like petroleum  jelly.  To prevent skin irritation on your cheeks or behind your ears, tuck some gauze under the tubing. Contact a health care provider if:  You get headaches often.  You have shortness of breath.  You have a lasting cough.  You have anxiety.  You are sleepy all the time.  You develop an illness that affects your breathing.  You cannot exercise at your regular level.  You are restless.  You have difficult or irregular breathing, and it is getting worse.  You have a fever.  You have persistent redness under your nose. Get help right away if:  You are confused.  You have blue lips or fingernails.  You are struggling to breathe. This information is not intended to replace advice given to you by your health care provider. Make sure you discuss any questions you have with your health care provider. Document Released: 03/22/2003 Document Revised: 08/29/2015 Document Reviewed: 07/24/2015 Elsevier Interactive Patient Education  [employer].

## 2017-05-15 NOTE — Progress Notes (Signed)
Patient's IV removed.  Site WNL.  AVS reviewed with patient.  Verbalized understanding of discharge instructions, physician follow-up, medications.  Oxygen approved by home health and delivered along with nebulizer machine prior to discharge.  Patient transported by NT via w/c to main entrance at discharge. Patient stable at time of discharge.

## 2017-05-18 NOTE — Discharge Summary (Signed)
Physician Discharge Summary  [name] MWN:[id number] DOB: February 19, 1960 DOA: 05/09/2017  PCP: Patient, No Pcp Per  Admit date: 05/09/2017 Discharge date: 05/18/2017  Admitted From: Home  Disposition: Home  Recommendations for Outpatient Follow-up:  1. Follow up with PCP in 1 weeks 2. Follow up with cardiology in 2 weeks 3. Please obtain BMP/CBC in one week  Discharge Condition: STABLE   CODE STATUS: FULL    Brief Hospitalization Summary: Please see all hospital notes, images, labs for full details of the hospitalization.  [name] a 57 y.o.femalewith a history of EtOH presents with SOBthat started 2 days ago. Symptoms are worsening. Worse with exertion and improves with rest. She is a tobacco user and reportedly smokes 2 cigarettes on average, although nursing notes report a pack a day. Came to the emergency department due to feeling like she could not keep to catch her breath.  MDM/Assessment & Plan:   1. Acute respiratory failure with hypoxia -secondary to COPD exacerbation and superimposed congestive heart failure.  Patient is being admitted for IV diuresis with Lasix.  The COPD was treated aggressively with IV steroids, antibiotics, continuous around-the-clock neb treatments and oxygen supplement as needed.  Pt says that she is having more productive cough.  Pt improved to be able to discharge home.  Pt was discharged with home health, oxygen and recommendations for outpatient follow up.   2. CHF exacerbation-acute systolic- echocardiogram below.  Continue diuresis.  IV Lasix ordered today for stronger diuresis response.  Monitor daily weights, intake and output.  Pt was asked to follow up with cardiology outpatient.   3. Essential hypertension-patient was started on blood pressure lowering medications during admission, will follow and adjust as appropriate. 4. Hypokalemia-oral replacement ordered.    Echocardiogram: 05/10/17 Study Conclusions  - Left ventricle:  The cavity size was normal. Wall thickness was  increased in a pattern of mild LVH. Systolic function was normal.   The estimated ejection fraction was in the range of 55% to 60%. Wall motion was normal; there were no regional wall motion   abnormalities. Doppler parameters are consistent with  pseudonormal left ventricular relaxation (grade 2 diastolic dysfunction). The E/e&' ratio is >20, suggesting elevated LV  filling pressure. - Aortic valve: Poorly visualized. There was trivial regurgitation. - Aorta: Mildly dilated aortic root - not well visualized. Aortic  root dimension: 39 mm (ED). - Mitral valve: Poorly visualized. There was trivial regurgitation. - Left atrium: Moderately dilated. - Right atrium: The atrium was mildly dilated. - Inferior vena cava: The vessel was dilated. The respirophasic diameter changes were blunted (< 50%), consistent with elevated   central venous pressure.  Impressions:  - Technically difficult study. LVEF 55-60%, mild LVH, grossly normal wall motion, grade 2 DD, elevated LV filling pressure,   moderate LAE and mild RAE, dilated IVC, borderline aortic root dilation at 3.9 cm.  DVT prophylaxis: Lovenox Code Status: Full Family Communication: Patient Disposition Plan: Home   Discharge Diagnoses:  Principal Problem:   Acute respiratory failure with hypoxia ([place]) Active Problems:   COPD with acute exacerbation ([facility])   Essential hypertension   Acute systolic CHF (congestive heart failure) ([place])  Discharge Instructions: Discharge Instructions    (HEART FAILURE PATIENTS) Call MD:  Anytime you have any of the following symptoms: 1) 3 pound weight gain in 24 hours or 5 pounds in 1 week 2) shortness of breath, with or without a dry hacking cough 3) swelling in the hands, feet or stomach 4) if you  have to sleep on extra pillows at night in order to breathe.   Complete by:  As directed    Call MD for:  difficulty breathing, headache or visual disturbances    Complete by:  As directed    Call MD for:  extreme fatigue   Complete by:  As directed    Call MD for:  persistant dizziness or light-headedness   Complete by:  As directed    Call MD for:  persistant nausea and vomiting   Complete by:  As directed    Call MD for:  severe uncontrolled pain   Complete by:  As directed    Diet - low sodium heart healthy   Complete by:  As directed    Increase activity slowly   Complete by:  As directed      Allergies as of 05/12/2017      Reactions   Lisinopril Hives   REACTION: Breaks out in whelts   Oxycodone-acetaminophen Itching   REACTION: Causes itching. Pt states she can tolerate acetaminophen   Propoxyphene N-acetaminophen Itching      Medication List    TAKE these medications   albuterol 108 (90 Base) MCG/ACT inhaler Commonly known as:  PROVENTIL HFA;VENTOLIN HFA Inhale 2 puffs into the lungs every 4 (four) hours as needed for wheezing or shortness of breath (cough, shortness of breath or wheezing.).   aspirin EC 325 MG tablet Take 325 mg by mouth daily as needed for mild pain.   carvedilol 6.25 MG tablet Commonly known as:  COREG Take 1 tablet (6.25 mg total) by mouth 2 (two) times daily with a meal.   furosemide 40 MG tablet Commonly known as:  LASIX Take 1 tablet (40 mg total) by mouth daily.   ipratropium-albuterol 0.5-2.5 (3) MG/3ML Soln Commonly known as:  DUONEB Take 3 mLs by nebulization every 4 (four) hours as needed (wheezing, coughing, SOB).   losartan 50 MG tablet Commonly known as:  COZAAR Take 1 tablet (50 mg total) by mouth daily.   potassium chloride SA 20 MEQ tablet Commonly known as:  K-DUR,KLOR-CON Take 2 tablets (40 mEq total) by mouth daily.   predniSONE 20 MG tablet Commonly known as:  DELTASONE Take 3 PO QAM x3days, 2 PO QAM x3days, 1 PO QAM x3days     ASK your doctor about these medications   doxycycline 100 MG tablet Commonly known as:  VIBRA-TABS Take 1 tablet (100 mg total) by mouth every  12 (twelve) hours for 5 days. Ask about: Should I take this medication?      Follow-up Information    Erma Heritage, PA-C. Schedule an appointment as soon as possible for a visit in 2 week(s).   Specialties:  Physician Assistant, Cardiology Why:  establish care with cardiology regarding diastolic heart failure Hospital follow-up on 06/04/2017 at 10:40 a.m. will be with Dr. Nolen Mu information: [address] [place] [address] [phone]        Establish new Primary Care Physician Follow up.   Why:  Refer to list provided by Case Management.  Will need PCP for prescription refills         Allergies  Allergen Reactions  . Lisinopril Hives    REACTION: Breaks out in whelts  . Oxycodone-Acetaminophen Itching    REACTION: Causes itching. Pt states she can tolerate acetaminophen  . Propoxyphene N-Acetaminophen Itching   Allergies as of 05/12/2017      Reactions   Lisinopril Hives   REACTION: Breaks out  in whelts   Oxycodone-acetaminophen Itching   REACTION: Causes itching. Pt states she can tolerate acetaminophen   Propoxyphene N-acetaminophen Itching      Medication List    TAKE these medications   albuterol 108 (90 Base) MCG/ACT inhaler Commonly known as:  PROVENTIL HFA;VENTOLIN HFA Inhale 2 puffs into the lungs every 4 (four) hours as needed for wheezing or shortness of breath (cough, shortness of breath or wheezing.).   aspirin EC 325 MG tablet Take 325 mg by mouth daily as needed for mild pain.   carvedilol 6.25 MG tablet Commonly known as:  COREG Take 1 tablet (6.25 mg total) by mouth 2 (two) times daily with a meal.   furosemide 40 MG tablet Commonly known as:  LASIX Take 1 tablet (40 mg total) by mouth daily.   ipratropium-albuterol 0.5-2.5 (3) MG/3ML Soln Commonly known as:  DUONEB Take 3 mLs by nebulization every 4 (four) hours as needed (wheezing, coughing, SOB).   losartan 50 MG tablet Commonly known as:  COZAAR Take 1 tablet (50  mg total) by mouth daily.   potassium chloride SA 20 MEQ tablet Commonly known as:  K-DUR,KLOR-CON Take 2 tablets (40 mEq total) by mouth daily.   predniSONE 20 MG tablet Commonly known as:  DELTASONE Take 3 PO QAM x3days, 2 PO QAM x3days, 1 PO QAM x3days     ASK your doctor about these medications   doxycycline 100 MG tablet Commonly known as:  VIBRA-TABS Take 1 tablet (100 mg total) by mouth every 12 (twelve) hours for 5 days. Ask about: Should I take this medication?       Procedures/Studies: Dg Chest Port 1 View  Result Date: 05/11/2017 CLINICAL DATA:  Acute respiratory failure with hypoxia. EXAM: PORTABLE CHEST 1 VIEW COMPARISON:  Radiograph of May 09, 2017. FINDINGS: Stable cardiomegaly with central pulmonary vascular congestion. No pneumothorax or pleural effusion is noted. No consolidative process is noted. Bony thorax is unremarkable. IMPRESSION: Stable cardiomegaly with probable mild central pulmonary vascular congestion. Electronically Signed   By: Marijo Conception, M.D.   On: 05/11/2017 07:27   Dg Chest Port 1 View  Result Date: 05/09/2017 CLINICAL DATA:  COUGH, Patient reports difficulty breathing for the past 2 years. Patient states her breathing became much worse last night, has felt like "there's no air in the room." Patient reports non productive coughHISTORY OF ANXIETY, ETOH ABUSE EXAM: PORTABLE CHEST 1 VIEW COMPARISON:  01/05/2010 FINDINGS: Cardiac silhouette is mildly enlarged. No mediastinal or hilar masses. No evidence of adenopathy. Clear lungs. No pleural effusion.  No pneumothorax. Skeletal structures are grossly intact. IMPRESSION: No acute cardiopulmonary disease. Electronically Signed   By: Lajean Manes M.D.   On: 05/09/2017 16:14      Subjective: Pt says that she is feeling much better this morning.  She is asking to go home.    Discharge Exam: Vitals:   05/12/17 0820 05/12/17 0921  BP:    Pulse:  87  Resp:    Temp:    SpO2: 96%    Vitals:    05/12/17 0444 05/12/17 0539 05/12/17 0820 05/12/17 0921  BP:  (!) 167/83    Pulse:  71  87  Resp:      Temp:  97.7 F (36.5 C)    TempSrc:  Oral    SpO2: 96% 91% 96%   Weight:      Height:        General: Pt is alert, awake, not in acute distress Cardiovascular: RRR,  S1/S2 +, no rubs, no gallops Respiratory: improved air movement, less wheezing. Abdominal: Soft, NT, ND, bowel sounds + Extremities: no edema, no cyanosis   The results of significant diagnostics from this hospitalization (including imaging, microbiology, ancillary and laboratory) are listed below for reference.     Microbiology: No results found for this or any previous visit (from the past 240 hour(s)).   Labs: BNP (last 3 results) Recent Labs    05/09/17 1524  BNP 540.0*   Basic Metabolic Panel: No results for input(s): NA, K, CL, CO2, GLUCOSE, BUN, CREATININE, CALCIUM, MG, PHOS in the last 168 hours. Liver Function Tests: No results for input(s): AST, ALT, ALKPHOS, BILITOT, PROT, ALBUMIN in the last 168 hours. No results for input(s): LIPASE, AMYLASE in the last 168 hours. No results for input(s): AMMONIA in the last 168 hours. CBC: No results for input(s): WBC, NEUTROABS, HGB, HCT, MCV, PLT in the last 168 hours. Cardiac Enzymes: No results for input(s): CKTOTAL, CKMB, CKMBINDEX, TROPONINI in the last 168 hours. BNP: Invalid input(s): POCBNP CBG: No results for input(s): GLUCAP in the last 168 hours. D-Dimer No results for input(s): DDIMER in the last 72 hours. Hgb A1c No results for input(s): HGBA1C in the last 72 hours. Lipid Profile No results for input(s): CHOL, HDL, LDLCALC, TRIG, CHOLHDL, LDLDIRECT in the last 72 hours. Thyroid function studies No results for input(s): TSH, T4TOTAL, T3FREE, THYROIDAB in the last 72 hours.  Invalid input(s): FREET3 Anemia work up No results for input(s): VITAMINB12, FOLATE, FERRITIN, TIBC, IRON, RETICCTPCT in the last 72 hours. Urinalysis    Component  Value Date/Time   COLORURINE YELLOW 12/27/2009 1935   APPEARANCEUR CLEAR 12/27/2009 1935   LABSPEC 1.015 12/27/2009 1935   PHURINE 7.0 12/27/2009 1935   GLUCOSEU NEGATIVE 12/27/2009 1935   HGBUR LARGE (A) 12/27/2009 1935   BILIRUBINUR NEGATIVE 12/27/2009 1935   KETONESUR NEGATIVE 12/27/2009 1935   PROTEINUR NEGATIVE 12/27/2009 1935   UROBILINOGEN 0.2 12/27/2009 1935   NITRITE NEGATIVE 12/27/2009 1935   LEUKOCYTESUR NEGATIVE 12/27/2009 1935   Sepsis Labs Invalid input(s): PROCALCITONIN,  WBC,  LACTICIDVEN Microbiology No results found for this or any previous visit (from the past 240 hour(s)).  Time coordinating discharge:   SIGNED:  Irwin Brakeman, MD  [facility] 05/18/2017, 6:40 AM Pager [phone]  If 7PM-7AM, please contact night-coverage [email or web address] Password [email or web address]

## 2017-05-19 DIAGNOSIS — I509 Heart failure, unspecified: Secondary | ICD-10-CM | POA: Diagnosis not present

## 2017-05-19 DIAGNOSIS — J449 Chronic obstructive pulmonary disease, unspecified: Secondary | ICD-10-CM | POA: Diagnosis not present

## 2017-05-19 DIAGNOSIS — F17200 Nicotine dependence, unspecified, uncomplicated: Secondary | ICD-10-CM | POA: Diagnosis not present

## 2017-05-19 DIAGNOSIS — I10 Essential (primary) hypertension: Secondary | ICD-10-CM | POA: Diagnosis not present

## 2017-05-22 ENCOUNTER — Emergency Department (HOSPITAL_COMMUNITY): Payer: BLUE CROSS/BLUE SHIELD

## 2017-05-22 ENCOUNTER — Emergency Department (HOSPITAL_COMMUNITY)
Admission: EM | Admit: 2017-05-22 | Discharge: 2017-05-23 | Disposition: A | Discharge status: Home / Self Care | Payer: BLUE CROSS/BLUE SHIELD | Source: Home / Self Care | Attending: Emergency Medicine | Admitting: Emergency Medicine

## 2017-05-22 ENCOUNTER — Other Ambulatory Visit: Payer: Self-pay

## 2017-05-22 ENCOUNTER — Encounter (HOSPITAL_COMMUNITY): Payer: Self-pay

## 2017-05-22 DIAGNOSIS — J9621 Acute and chronic respiratory failure with hypoxia: Secondary | ICD-10-CM | POA: Diagnosis not present

## 2017-05-22 DIAGNOSIS — I10 Essential (primary) hypertension: Secondary | ICD-10-CM | POA: Diagnosis not present

## 2017-05-22 DIAGNOSIS — M79662 Pain in left lower leg: Secondary | ICD-10-CM | POA: Diagnosis not present

## 2017-05-22 DIAGNOSIS — M545 Low back pain: Secondary | ICD-10-CM | POA: Diagnosis not present

## 2017-05-22 DIAGNOSIS — J449 Chronic obstructive pulmonary disease, unspecified: Secondary | ICD-10-CM | POA: Diagnosis not present

## 2017-05-22 DIAGNOSIS — T426X5A Adverse effect of other antiepileptic and sedative-hypnotic drugs, initial encounter: Secondary | ICD-10-CM | POA: Diagnosis present

## 2017-05-22 DIAGNOSIS — F419 Anxiety disorder, unspecified: Secondary | ICD-10-CM | POA: Diagnosis not present

## 2017-05-22 DIAGNOSIS — M5416 Radiculopathy, lumbar region: Secondary | ICD-10-CM | POA: Diagnosis not present

## 2017-05-22 DIAGNOSIS — I110 Hypertensive heart disease with heart failure: Secondary | ICD-10-CM | POA: Diagnosis not present

## 2017-05-22 DIAGNOSIS — F1520 Other stimulant dependence, uncomplicated: Secondary | ICD-10-CM | POA: Diagnosis not present

## 2017-05-22 DIAGNOSIS — Z9981 Dependence on supplemental oxygen: Secondary | ICD-10-CM | POA: Diagnosis not present

## 2017-05-22 DIAGNOSIS — M25552 Pain in left hip: Secondary | ICD-10-CM

## 2017-05-22 DIAGNOSIS — Z888 Allergy status to other drugs, medicaments and biological substances status: Secondary | ICD-10-CM | POA: Diagnosis not present

## 2017-05-22 DIAGNOSIS — Z79899 Other long term (current) drug therapy: Secondary | ICD-10-CM | POA: Diagnosis not present

## 2017-05-22 DIAGNOSIS — R4020 Unspecified coma: Secondary | ICD-10-CM | POA: Diagnosis not present

## 2017-05-22 DIAGNOSIS — G8929 Other chronic pain: Secondary | ICD-10-CM

## 2017-05-22 DIAGNOSIS — D72829 Elevated white blood cell count, unspecified: Secondary | ICD-10-CM | POA: Diagnosis not present

## 2017-05-22 DIAGNOSIS — J9690 Respiratory failure, unspecified, unspecified whether with hypoxia or hypercapnia: Secondary | ICD-10-CM | POA: Diagnosis not present

## 2017-05-22 DIAGNOSIS — F1010 Alcohol abuse, uncomplicated: Secondary | ICD-10-CM | POA: Diagnosis not present

## 2017-05-22 DIAGNOSIS — G9341 Metabolic encephalopathy: Secondary | ICD-10-CM | POA: Diagnosis not present

## 2017-05-22 DIAGNOSIS — I5021 Acute systolic (congestive) heart failure: Secondary | ICD-10-CM

## 2017-05-22 DIAGNOSIS — M25562 Pain in left knee: Secondary | ICD-10-CM

## 2017-05-22 DIAGNOSIS — M5442 Lumbago with sciatica, left side: Secondary | ICD-10-CM | POA: Insufficient documentation

## 2017-05-22 DIAGNOSIS — M5116 Intervertebral disc disorders with radiculopathy, lumbar region: Secondary | ICD-10-CM | POA: Diagnosis not present

## 2017-05-22 DIAGNOSIS — J9622 Acute and chronic respiratory failure with hypercapnia: Secondary | ICD-10-CM | POA: Diagnosis not present

## 2017-05-22 DIAGNOSIS — G629 Polyneuropathy, unspecified: Secondary | ICD-10-CM | POA: Diagnosis not present

## 2017-05-22 DIAGNOSIS — Z7982 Long term (current) use of aspirin: Secondary | ICD-10-CM | POA: Diagnosis not present

## 2017-05-22 DIAGNOSIS — F17210 Nicotine dependence, cigarettes, uncomplicated: Secondary | ICD-10-CM | POA: Diagnosis not present

## 2017-05-22 DIAGNOSIS — I5042 Chronic combined systolic (congestive) and diastolic (congestive) heart failure: Secondary | ICD-10-CM | POA: Diagnosis not present

## 2017-05-22 DIAGNOSIS — I509 Heart failure, unspecified: Secondary | ICD-10-CM | POA: Diagnosis not present

## 2017-05-22 DIAGNOSIS — R4182 Altered mental status, unspecified: Secondary | ICD-10-CM | POA: Diagnosis not present

## 2017-05-22 DIAGNOSIS — Z9049 Acquired absence of other specified parts of digestive tract: Secondary | ICD-10-CM | POA: Diagnosis not present

## 2017-05-22 DIAGNOSIS — R509 Fever, unspecified: Secondary | ICD-10-CM | POA: Diagnosis not present

## 2017-05-22 DIAGNOSIS — M79605 Pain in left leg: Secondary | ICD-10-CM | POA: Diagnosis not present

## 2017-05-22 DIAGNOSIS — F17200 Nicotine dependence, unspecified, uncomplicated: Secondary | ICD-10-CM | POA: Diagnosis not present

## 2017-05-22 DIAGNOSIS — I5032 Chronic diastolic (congestive) heart failure: Secondary | ICD-10-CM | POA: Diagnosis not present

## 2017-05-22 DIAGNOSIS — I70213 Atherosclerosis of native arteries of extremities with intermittent claudication, bilateral legs: Secondary | ICD-10-CM | POA: Diagnosis not present

## 2017-05-22 MED ORDER — OXYCODONE-ACETAMINOPHEN 5-325 MG PO TABS
1.0000 | ORAL_TABLET | Freq: Once | ORAL | Status: AC
  Administered 2017-05-22: 1 via ORAL
  Filled 2017-05-22: qty 1

## 2017-05-22 NOTE — ED Provider Notes (Signed)
[facility] EMERGENCY DEPARTMENT Provider Note   CSN: [id number] Arrival date & time: 05/22/17  2111     History   Chief Complaint Chief Complaint  Patient presents with  . left leg pain  . bilateral arm pain    HPI [name] is a 57 y.o. female.  HPI  57 year old female who was recently discharged 4 days ago after admission for respiratory failure secondary to COPD and congestive heart failure presents today with worsening back, left hip, and left knee pain.  She states that she has had left back pain for 15 years but now feels that it is worsening.  She also complains of pain in her left hip and left knee.  She also complains of some left shoulder pain but states that this has been worked up and is chronic in nature.  She states that today she had pain was unable to put weight on this leg.  She denies any trauma.  She denies any redness or increased swelling.  She denies any history of DVT or fracture.  She has recently been on a prednisone taper which she states she has completed.  She has not taken any other pain medication for this.  Patient is a smoker.  She has a history of alcohol abuse.  She reports being started on blood pressure medicines for the first time during this past hospitalization.  She now reports that her blood pressure has been low.  She is scheduled to follow-up with Dr. Nevada Crane and has had an intake visit.  She is not having chest pain in her breathing is at baseline.  Past Medical History:  Diagnosis Date  . Anxiety   . ETOH abuse   . Hemorrhoids   . 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 COPD with acute exacerbation ([place]) 05/09/2017  . Essential hypertension 05/09/2017  . Acute systolic CHF (congestive heart failure) ([place]) 05/09/2017  . Acute respiratory failure with hypoxia ([place]) 05/09/2017  . ALCOHOL ABUSE, HX OF 01/30/2010  . PANCREATITIS, HX OF 01/30/2010    Past Surgical  History:  Procedure Laterality Date  . CHOLECYSTECTOMY  90's  . ERCP     in the past with Dr.Rourk for CBD stone?  . TUBAL LIGATION       OB History    Gravida  4   Para  3   Term  3   Preterm      AB  1   Living        SAB  1   TAB      Ectopic      Multiple      Live Births               Home Medications    Prior to Admission medications   Medication Sig Start Date End Date Taking? Authorizing Provider  albuterol (PROVENTIL HFA;VENTOLIN HFA) 108 (90 Base) MCG/ACT inhaler Inhale 2 puffs into the lungs every 4 (four) hours as needed for wheezing or shortness of breath (cough, shortness of breath or wheezing.). 05/12/17   Murlean Iba, MD  aspirin EC 325 MG tablet Take 325 mg by mouth daily as needed for mild pain.    [provider]  carvedilol (COREG) 6.25 MG tablet Take 1 tablet (6.25 mg total) by mouth 2 (two) times daily with a meal. 05/12/17 06/11/17  Murlean Iba, MD  furosemide (LASIX)  40 MG tablet Take 1 tablet (40 mg total) by mouth daily. 05/12/17 06/11/17  Johnson, Clanford L, MD  ipratropium-albuterol (DUONEB) 0.5-2.5 (3) MG/3ML SOLN Take 3 mLs by nebulization every 4 (four) hours as needed (wheezing, coughing, SOB). 05/12/17   Johnson, Clanford L, MD  losartan (COZAAR) 50 MG tablet Take 1 tablet (50 mg total) by mouth daily. 05/12/17 06/11/17  Johnson, Clanford L, MD  potassium chloride SA (K-DUR,KLOR-CON) 20 MEQ tablet Take 2 tablets (40 mEq total) by mouth daily. 05/12/17 06/11/17  Wynetta Emery, Clanford L, MD  predniSONE (DELTASONE) 20 MG tablet Take 3 PO QAM x3days, 2 PO QAM x3days, 1 PO QAM x3days 05/12/17   Murlean Iba, MD    Family History No family history on file.  Social History Social History   Tobacco Use  . Smoking status: Current Every Day Smoker    Packs/day: 0.50    Types: Cigarettes  . Smokeless tobacco: Never Used  . Tobacco comment: 2 a day per pt report  Substance Use Topics  . Alcohol use: Yes     Comment: occas  . Drug use: Never     Allergies   Lisinopril and Propoxyphene n-acetaminophen   Review of Systems Review of Systems  All other systems reviewed and are negative.    Physical Exam Updated Vital Signs BP (!) 94/55 (BP Location: Left Arm)   Pulse 68   Temp (!) 97.4 F (36.3 C) (Oral)   Resp 18   Ht 1.676 m (5\' 6" )   Wt 96.2 kg (212 lb)   SpO2 93%   BMI 34.22 kg/m   Physical Exam  Constitutional: She is oriented to person, place, and time. She appears well-developed and well-nourished. No distress.  HENT:  Head: Normocephalic.  Eyes: Pupils are equal, round, and reactive to light.  Neck: Normal range of motion.  Cardiovascular: Normal rate and regular rhythm.  Pulmonary/Chest: Effort normal. She has wheezes.  Abdominal: Soft. Bowel sounds are normal.  Musculoskeletal: Normal range of motion. She exhibits no deformity.  Pulses are intact Toes are pink there are no signs of ischemia or infection  Neurological: She is alert and oriented to person, place, and time. She displays normal reflexes. No cranial nerve deficit or sensory deficit. She exhibits normal muscle tone. Coordination normal.  Perineal sensation intact Strength equal bilateral lower extremities but appears decreased due to pain   Skin: Skin is warm. Capillary refill takes less than 2 seconds.  Psychiatric: She has a normal mood and affect.  Nursing note and vitals reviewed.    ED Treatments / Results  Labs (all labs ordered are listed, but only abnormal results are displayed) Labs Reviewed  CBC WITH DIFFERENTIAL/PLATELET  BASIC METABOLIC PANEL    EKG None  Radiology No results found.  Procedures Procedures (including critical care time)  Medications Ordered in ED Medications  oxyCODONE-acetaminophen (PERCOCET/ROXICET) 5-325 MG per tablet 1 tablet (has no administration in time range)     Initial Impression / Assessment and Plan / ED Course  I have reviewed the triage  vital signs and the nursing notes.  Pertinent labs & imaging results that were available during my care of the patient were reviewed by me and considered in my medical decision making (see chart for details).   1- low back/hip knee pain- plan imaging- musculoskeletal and neuro exam normal here.  No red flags- sensation intact, no loss of b/b controlPain being treated here with percocet 2- normal to low bp- plan check labs and adjust  meds  Discussed with Dr.Wickline and he will reevaluate after studies completed Final Clinical Impressions(s) / ED Diagnoses   Final diagnoses:  Chronic left-sided low back pain with left-sided sciatica  Left hip pain  Left knee pain, unspecified chronicity    ED Discharge Orders    None       Pattricia Boss, MD 05/23/17 [phone]

## 2017-05-22 NOTE — ED Notes (Signed)
Per pt and family pt had previous back issues, undx, and put on over 100lbs which made the pain worse. Pt now states she feels like she has a pinched nerve in her neck that causes pain and numbness down her arms into her hands. Today pain to L hip radiating down to L foot began, pt states she cannot bear weight but can move leg while sitting. No injury.

## 2017-05-22 NOTE — ED Triage Notes (Signed)
Pt reports bilateral arm/shoulder pain for 1-2 months with hx of neuropathy. Reports left leg pain that started today, reports unable to bear weight per report. Pt is able to extend leg in triage.

## 2017-05-23 DIAGNOSIS — M25552 Pain in left hip: Secondary | ICD-10-CM | POA: Diagnosis not present

## 2017-05-23 LAB — CBC WITH DIFFERENTIAL/PLATELET
BASOS ABS: 0 10*3/uL (ref 0.0–0.1)
BASOS PCT: 0 %
Eosinophils Absolute: 0.2 10*3/uL (ref 0.0–0.7)
Eosinophils Relative: 1 %
HCT: 51.9 % — ABNORMAL HIGH (ref 36.0–46.0)
HEMOGLOBIN: 16.1 g/dL — AB (ref 12.0–15.0)
Lymphocytes Relative: 24 %
Lymphs Abs: 4.1 10*3/uL — ABNORMAL HIGH (ref 0.7–4.0)
MCH: 32.3 pg (ref 26.0–34.0)
MCHC: 31 g/dL (ref 30.0–36.0)
MCV: 104 fL — ABNORMAL HIGH (ref 78.0–100.0)
Monocytes Absolute: 1.7 10*3/uL — ABNORMAL HIGH (ref 0.1–1.0)
Monocytes Relative: 9 %
NEUTROS PCT: 66 %
Neutro Abs: 11.5 10*3/uL — ABNORMAL HIGH (ref 1.7–7.7)
Platelets: 210 10*3/uL (ref 150–400)
RBC: 4.99 MIL/uL (ref 3.87–5.11)
RDW: 13.1 % (ref 11.5–15.5)
WBC: 17.5 10*3/uL — AB (ref 4.0–10.5)

## 2017-05-23 LAB — BASIC METABOLIC PANEL
ANION GAP: 8 (ref 5–15)
BUN: 14 mg/dL (ref 6–20)
CHLORIDE: 96 mmol/L — AB (ref 101–111)
CO2: 36 mmol/L — ABNORMAL HIGH (ref 22–32)
CREATININE: 0.82 mg/dL (ref 0.44–1.00)
Calcium: 8.8 mg/dL — ABNORMAL LOW (ref 8.9–10.3)
GFR calc non Af Amer: >60 mL/min (ref >60)
Glucose, Bld: 98 mg/dL (ref 65–99)
POTASSIUM: 3.3 mmol/L — AB (ref 3.5–5.1)
SODIUM: 140 mmol/L (ref 135–145)

## 2017-05-23 MED ORDER — OXYCODONE-ACETAMINOPHEN 5-325 MG PO TABS: 1.0000 | ORAL_TABLET | Freq: Three times a day (TID) | ORAL | 0 refills | Status: DC | PRN

## 2017-05-23 MED ORDER — OXYCODONE-ACETAMINOPHEN 5-325 MG PO TABS
1.0000 | ORAL_TABLET | Freq: Once | ORAL | Status: AC
  Administered 2017-05-23: 1 via ORAL
  Filled 2017-05-23: qty 1

## 2017-05-23 NOTE — ED Notes (Signed)
Pt ambulated in the hall with little assistance, pt c/o left knee pain and feeling that her leg was going to go out; pt's family member asking for a walker, Dr. Christy Gentles informed of pt request; pt taken out to car via wheelchair

## 2017-05-23 NOTE — ED Notes (Signed)
PT PLACED ON O2 AT 3 L; PT STATES SHE IS ON O2 ALL THE TIME

## 2017-05-23 NOTE — Discharge Instructions (Signed)

## 2017-05-23 NOTE — ED Provider Notes (Signed)
I assumed care in sign out to follow-up on imaging.  There is no acute findings.  No fractures.  Patient appears improved and was sitting up in the bed in the room.  I discussed with patient and family at length about her condition.  She reports chronic back pain for years, but only  recently she had pain in her left leg.  She reports it is worse in the morning and improves throughout the day and she is able to ambulate at home. She is motivated to go home.  She does not have any home health, therefore I offered home health home health evaluation for PT/OT/RN.  Short course of pain medicines have been ordered.  I advised her to follow-up closely with her PCP as she would likely benefit from an MRI lumbar spine. Narcotic database reviewed and considered in decision making She is able to stand and ambulate here.  Her blood pressure is now above 100 We discussed strict return precautions.  She is also requesting a walker which I will prescribe.   Ripley Fraise, MD 05/23/17 0157

## 2017-05-23 NOTE — ED Notes (Signed)
Pt given pillow.

## 2017-05-24 ENCOUNTER — Other Ambulatory Visit: Payer: Self-pay

## 2017-05-24 ENCOUNTER — Encounter (HOSPITAL_COMMUNITY): Payer: Self-pay | Admitting: Emergency Medicine

## 2017-05-24 ENCOUNTER — Inpatient Hospital Stay (HOSPITAL_COMMUNITY)
Admission: EM | Admit: 2017-05-24 | Discharge: 2017-05-26 | DRG: 189 | Disposition: A | Discharge status: Home Health | Payer: BLUE CROSS/BLUE SHIELD | Attending: Internal Medicine | Admitting: Internal Medicine

## 2017-05-24 ENCOUNTER — Emergency Department (HOSPITAL_COMMUNITY): Payer: BLUE CROSS/BLUE SHIELD

## 2017-05-24 DIAGNOSIS — F17200 Nicotine dependence, unspecified, uncomplicated: Secondary | ICD-10-CM | POA: Diagnosis not present

## 2017-05-24 DIAGNOSIS — G9341 Metabolic encephalopathy: Secondary | ICD-10-CM | POA: Diagnosis not present

## 2017-05-24 DIAGNOSIS — M5116 Intervertebral disc disorders with radiculopathy, lumbar region: Secondary | ICD-10-CM | POA: Diagnosis not present

## 2017-05-24 DIAGNOSIS — I5042 Chronic combined systolic (congestive) and diastolic (congestive) heart failure: Secondary | ICD-10-CM | POA: Diagnosis present

## 2017-05-24 DIAGNOSIS — R4020 Unspecified coma: Secondary | ICD-10-CM | POA: Diagnosis not present

## 2017-05-24 DIAGNOSIS — G629 Polyneuropathy, unspecified: Secondary | ICD-10-CM | POA: Diagnosis present

## 2017-05-24 DIAGNOSIS — M79606 Pain in leg, unspecified: Secondary | ICD-10-CM

## 2017-05-24 DIAGNOSIS — G8929 Other chronic pain: Secondary | ICD-10-CM | POA: Diagnosis present

## 2017-05-24 DIAGNOSIS — M79669 Pain in unspecified lower leg: Secondary | ICD-10-CM

## 2017-05-24 DIAGNOSIS — D72829 Elevated white blood cell count, unspecified: Secondary | ICD-10-CM | POA: Diagnosis present

## 2017-05-24 DIAGNOSIS — Z79899 Other long term (current) drug therapy: Secondary | ICD-10-CM

## 2017-05-24 DIAGNOSIS — J9621 Acute and chronic respiratory failure with hypoxia: Secondary | ICD-10-CM | POA: Diagnosis present

## 2017-05-24 DIAGNOSIS — F419 Anxiety disorder, unspecified: Secondary | ICD-10-CM | POA: Diagnosis present

## 2017-05-24 DIAGNOSIS — R509 Fever, unspecified: Secondary | ICD-10-CM | POA: Diagnosis present

## 2017-05-24 DIAGNOSIS — J9622 Acute and chronic respiratory failure with hypercapnia: Secondary | ICD-10-CM | POA: Diagnosis not present

## 2017-05-24 DIAGNOSIS — Z9981 Dependence on supplemental oxygen: Secondary | ICD-10-CM | POA: Diagnosis not present

## 2017-05-24 DIAGNOSIS — Z7982 Long term (current) use of aspirin: Secondary | ICD-10-CM | POA: Diagnosis not present

## 2017-05-24 DIAGNOSIS — Z9049 Acquired absence of other specified parts of digestive tract: Secondary | ICD-10-CM

## 2017-05-24 DIAGNOSIS — F17210 Nicotine dependence, cigarettes, uncomplicated: Secondary | ICD-10-CM | POA: Diagnosis present

## 2017-05-24 DIAGNOSIS — R4182 Altered mental status, unspecified: Secondary | ICD-10-CM | POA: Diagnosis not present

## 2017-05-24 DIAGNOSIS — I10 Essential (primary) hypertension: Secondary | ICD-10-CM | POA: Diagnosis present

## 2017-05-24 DIAGNOSIS — I110 Hypertensive heart disease with heart failure: Secondary | ICD-10-CM | POA: Diagnosis present

## 2017-05-24 DIAGNOSIS — M549 Dorsalgia, unspecified: Secondary | ICD-10-CM | POA: Diagnosis present

## 2017-05-24 DIAGNOSIS — Z888 Allergy status to other drugs, medicaments and biological substances status: Secondary | ICD-10-CM | POA: Diagnosis not present

## 2017-05-24 DIAGNOSIS — T426X5A Adverse effect of other antiepileptic and sedative-hypnotic drugs, initial encounter: Secondary | ICD-10-CM | POA: Diagnosis present

## 2017-05-24 DIAGNOSIS — I5032 Chronic diastolic (congestive) heart failure: Secondary | ICD-10-CM | POA: Diagnosis present

## 2017-05-24 DIAGNOSIS — F1520 Other stimulant dependence, uncomplicated: Secondary | ICD-10-CM | POA: Diagnosis present

## 2017-05-24 DIAGNOSIS — J449 Chronic obstructive pulmonary disease, unspecified: Secondary | ICD-10-CM | POA: Diagnosis present

## 2017-05-24 DIAGNOSIS — J9600 Acute respiratory failure, unspecified whether with hypoxia or hypercapnia: Secondary | ICD-10-CM | POA: Insufficient documentation

## 2017-05-24 DIAGNOSIS — F1010 Alcohol abuse, uncomplicated: Secondary | ICD-10-CM | POA: Diagnosis present

## 2017-05-24 DIAGNOSIS — J9690 Respiratory failure, unspecified, unspecified whether with hypoxia or hypercapnia: Secondary | ICD-10-CM | POA: Diagnosis not present

## 2017-05-24 DIAGNOSIS — M79662 Pain in left lower leg: Secondary | ICD-10-CM | POA: Diagnosis not present

## 2017-05-24 DIAGNOSIS — M545 Low back pain: Secondary | ICD-10-CM | POA: Diagnosis not present

## 2017-05-24 DIAGNOSIS — M79605 Pain in left leg: Secondary | ICD-10-CM | POA: Diagnosis not present

## 2017-05-24 DIAGNOSIS — I509 Heart failure, unspecified: Secondary | ICD-10-CM | POA: Diagnosis not present

## 2017-05-24 DIAGNOSIS — I70213 Atherosclerosis of native arteries of extremities with intermittent claudication, bilateral legs: Secondary | ICD-10-CM | POA: Diagnosis not present

## 2017-05-24 LAB — URINALYSIS, ROUTINE W REFLEX MICROSCOPIC
Bilirubin Urine: NEGATIVE
Glucose, UA: NEGATIVE mg/dL
KETONES UR: NEGATIVE mg/dL
Leukocytes, UA: NEGATIVE
NITRITE: NEGATIVE
PROTEIN: 100 mg/dL — AB
Specific Gravity, Urine: 1.014 (ref 1.005–1.030)
pH: 6 (ref 5.0–8.0)

## 2017-05-24 LAB — BLOOD GAS, ARTERIAL
ACID-BASE EXCESS: 7.6 mmol/L — AB (ref 0.0–2.0)
Acid-Base Excess: 9 mmol/L — ABNORMAL HIGH (ref 0.0–2.0)
Bicarbonate: 30.1 mmol/L — ABNORMAL HIGH (ref 20.0–28.0)
Bicarbonate: 29.8 mmol/L — ABNORMAL HIGH (ref 20.0–28.0)
DRAWN BY: 221791
Delivery systems: POSITIVE
Drawn by: 22223
EXPIRATORY PAP: 8
FIO2: 35
INSPIRATORY PAP: 16
O2 Content: 3 L/min
O2 SAT: 96.5 %
O2 Saturation: 91.5 %
PCO2 ART: 55.7 mmHg — AB (ref 32.0–48.0)
PH ART: 7.331 — AB (ref 7.350–7.450)
PO2 ART: 89.6 mmHg (ref 83.0–108.0)
RATE: 8 resp/min
pCO2 arterial: 68 mmHg (ref 32.0–48.0)
pH, Arterial: 7.385 (ref 7.350–7.450)
pO2, Arterial: 66.9 mmHg — ABNORMAL LOW (ref 83.0–108.0)

## 2017-05-24 LAB — RAPID URINE DRUG SCREEN, HOSP PERFORMED
Amphetamines: NOT DETECTED
Barbiturates: NOT DETECTED
Benzodiazepines: POSITIVE — AB
COCAINE: NOT DETECTED
OPIATES: NOT DETECTED
TETRAHYDROCANNABINOL: NOT DETECTED

## 2017-05-24 LAB — COMPREHENSIVE METABOLIC PANEL
ALT: 32 U/L (ref 14–54)
ANION GAP: 10 (ref 5–15)
AST: 17 U/L (ref 15–41)
Albumin: 3.4 g/dL — ABNORMAL LOW (ref 3.5–5.0)
Alkaline Phosphatase: 71 U/L (ref 38–126)
BILIRUBIN TOTAL: 1.2 mg/dL (ref 0.3–1.2)
BUN: 15 mg/dL (ref 6–20)
CHLORIDE: 94 mmol/L — AB (ref 101–111)
CO2: 32 mmol/L (ref 22–32)
Calcium: 9 mg/dL (ref 8.9–10.3)
Creatinine, Ser: 0.65 mg/dL (ref 0.44–1.00)
GFR calc Af Amer: >60 mL/min (ref >60)
GFR calc non Af Amer: >60 mL/min (ref >60)
GLUCOSE: 129 mg/dL — AB (ref 65–99)
POTASSIUM: 3.9 mmol/L (ref 3.5–5.1)
SODIUM: 136 mmol/L (ref 135–145)
Total Protein: 6.1 g/dL — ABNORMAL LOW (ref 6.5–8.1)

## 2017-05-24 LAB — ETHANOL: Alcohol, Ethyl (B): <10 mg/dL (ref <10)

## 2017-05-24 LAB — CBC WITH DIFFERENTIAL/PLATELET
BASOS ABS: 0 10*3/uL (ref 0.0–0.1)
Basophils Relative: 0 %
EOS ABS: 0 10*3/uL (ref 0.0–0.7)
Eosinophils Relative: 0 %
HEMATOCRIT: 52.3 % — AB (ref 36.0–46.0)
HEMOGLOBIN: 16.7 g/dL — AB (ref 12.0–15.0)
Lymphocytes Relative: 14 %
Lymphs Abs: 2.4 10*3/uL (ref 0.7–4.0)
MCH: 32.7 pg (ref 26.0–34.0)
MCHC: 31.9 g/dL (ref 30.0–36.0)
MCV: 102.5 fL — ABNORMAL HIGH (ref 78.0–100.0)
MONO ABS: 1.4 10*3/uL — AB (ref 0.1–1.0)
Monocytes Relative: 8 %
NEUTROS ABS: 13.5 10*3/uL — AB (ref 1.7–7.7)
NEUTROS PCT: 78 %
Platelets: 199 10*3/uL (ref 150–400)
RBC: 5.1 MIL/uL (ref 3.87–5.11)
RDW: 13 % (ref 11.5–15.5)
WBC: 17.3 10*3/uL — ABNORMAL HIGH (ref 4.0–10.5)

## 2017-05-24 LAB — I-STAT CG4 LACTIC ACID, ED: LACTIC ACID, VENOUS: 1.19 mmol/L (ref 0.5–1.9)

## 2017-05-24 LAB — LIPASE, BLOOD: LIPASE: 27 U/L (ref 11–51)

## 2017-05-24 LAB — AMMONIA: Ammonia: 42 umol/L — ABNORMAL HIGH (ref 9–35)

## 2017-05-24 MED ORDER — FUROSEMIDE 40 MG PO TABS
40.0000 mg | ORAL_TABLET | Freq: Every day | ORAL | Status: DC
  Administered 2017-05-24 – 2017-05-26 (×3): 40 mg via ORAL
  Filled 2017-05-24 (×3): qty 1

## 2017-05-24 MED ORDER — CARVEDILOL 3.125 MG PO TABS
6.2500 mg | ORAL_TABLET | Freq: Two times a day (BID) | ORAL | Status: DC
  Administered 2017-05-24 – 2017-05-26 (×5): 6.25 mg via ORAL
  Filled 2017-05-24 (×5): qty 2

## 2017-05-24 MED ORDER — METHYLPREDNISOLONE SODIUM SUCC 125 MG IJ SOLR
125.0000 mg | Freq: Once | INTRAMUSCULAR | Status: AC
  Administered 2017-05-24: 125 mg via INTRAVENOUS
  Filled 2017-05-24: qty 2

## 2017-05-24 MED ORDER — SODIUM CHLORIDE 0.9 % IV BOLUS
1000.0000 mL | Freq: Once | INTRAVENOUS | Status: AC
  Administered 2017-05-24: 1000 mL via INTRAVENOUS

## 2017-05-24 MED ORDER — IPRATROPIUM-ALBUTEROL 0.5-2.5 (3) MG/3ML IN SOLN
3.0000 mL | Freq: Four times a day (QID) | RESPIRATORY_TRACT | Status: DC
  Administered 2017-05-24 – 2017-05-26 (×7): 3 mL via RESPIRATORY_TRACT
  Filled 2017-05-24 (×8): qty 3

## 2017-05-24 MED ORDER — LOSARTAN POTASSIUM 50 MG PO TABS
50.0000 mg | ORAL_TABLET | Freq: Every day | ORAL | Status: DC
  Administered 2017-05-24 – 2017-05-26 (×3): 50 mg via ORAL
  Filled 2017-05-24 (×3): qty 1

## 2017-05-24 MED ORDER — SODIUM CHLORIDE 0.9 % IV SOLN: 250.0000 mL | INTRAVENOUS | Status: DC | PRN

## 2017-05-24 MED ORDER — ALBUTEROL SULFATE (2.5 MG/3ML) 0.083% IN NEBU
10.0000 mg | INHALATION_SOLUTION | Freq: Once | RESPIRATORY_TRACT | Status: AC
  Administered 2017-05-24: 10 mg via RESPIRATORY_TRACT
  Filled 2017-05-24: qty 12

## 2017-05-24 MED ORDER — SODIUM CHLORIDE 0.9% FLUSH
3.0000 mL | Freq: Two times a day (BID) | INTRAVENOUS | Status: DC
  Administered 2017-05-25 – 2017-05-26 (×3): 3 mL via INTRAVENOUS

## 2017-05-24 MED ORDER — ENOXAPARIN SODIUM 40 MG/0.4ML ~~LOC~~ SOLN
40.0000 mg | SUBCUTANEOUS | Status: DC
  Administered 2017-05-24 – 2017-05-25 (×2): 40 mg via SUBCUTANEOUS
  Filled 2017-05-24 (×2): qty 0.4

## 2017-05-24 MED ORDER — ASPIRIN EC 325 MG PO TBEC: 325.0000 mg | DELAYED_RELEASE_TABLET | Freq: Every day | ORAL | Status: DC | PRN

## 2017-05-24 MED ORDER — ONDANSETRON HCL 4 MG/2ML IJ SOLN: 4.0000 mg | Freq: Four times a day (QID) | INTRAMUSCULAR | Status: DC | PRN

## 2017-05-24 MED ORDER — ALBUTEROL SULFATE (2.5 MG/3ML) 0.083% IN NEBU
INHALATION_SOLUTION | RESPIRATORY_TRACT | Status: AC
  Administered 2017-05-24: 2.5 mg
  Filled 2017-05-24: qty 3

## 2017-05-24 MED ORDER — SODIUM CHLORIDE 0.9% FLUSH: 3.0000 mL | INTRAVENOUS | Status: DC | PRN

## 2017-05-24 MED ORDER — ONDANSETRON HCL 4 MG PO TABS: 4.0000 mg | ORAL_TABLET | Freq: Four times a day (QID) | ORAL | Status: DC | PRN

## 2017-05-24 NOTE — H&P (Addendum)
TRH H&P    Patient Demographics:    [name], is a 57 y.o. female  MRN: [id number]  DOB - Dec 03, 1960  Admit Date - 05/24/2017  Referring MD/NP/PA: Dr Jeanell Sparrow  Outpatient Primary MD for the patient is Celene Squibb, MD  Patient coming from: Home  Chief complaint- Altered mental status   HPI:    [name]  is a 57 y.o. female, with history of COPD, chronic systolic CHF on home oxygen was brought to hospital with altered mental status.  Patient lives home with her son.  2 days ago she was seen in the ED with left leg pain and was discharged home on Percocet.  As per patient's son she was also given lorazepam by her aunt who was visiting.  Patient might have taken lorazepam along with Percocet and was found to be somnolent today.  She also had some difficulty breathing. Patient was brought to ED by EMS She was put on BiPAP, ABG showed pH 7.331, PCO2 68.0, PO2 66.9  Patient is more awake at this time, denies chest pain. Complains of nausea but no vomiting or diarrhea. Denies dysuria urgency or frequency of urination. Complains of pain in the left calf.    Review of systems:      All other systems reviewed and are negative.   With Past History of the following :    Past Medical History:  Diagnosis Date  . Anxiety   . ETOH abuse   . Hemorrhoids   . 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 CHOLECYSTECTOMY  90's  . ERCP     in the past with Dr.Rourk for CBD stone?  . TUBAL LIGATION        Social History:      Social History   Tobacco Use  . Smoking status: Current Every Day Smoker    Packs/day: 0.50    Types: Cigarettes  . Smokeless tobacco: Never Used  . Tobacco comment: 2 a day per pt report  Substance Use Topics  . Alcohol use: Yes    Comment: occas       Family History :   Patient's sister had colon  cancer   Home Medications:   Prior to Admission medications   Medication Sig Start Date End Date Taking? Authorizing Provider  albuterol (PROVENTIL HFA;VENTOLIN HFA) 108 (90 Base) MCG/ACT inhaler Inhale 2 puffs into the lungs every 4 (four) hours as needed for wheezing or shortness of breath (cough, shortness of breath or wheezing.). 05/12/17   Murlean Iba, MD  aspirin EC 325 MG tablet Take 325 mg by mouth daily as needed for mild pain.    [provider]  carvedilol (COREG) 6.25 MG tablet Take 1 tablet (6.25 mg total) by mouth 2 (two) times daily with a meal. 05/12/17 06/11/17  Johnson, Clanford L, MD  furosemide (LASIX) 40 MG tablet Take 1 tablet (40 mg total) by mouth daily. 05/12/17 06/11/17  Johnson, Clanford L,  MD  ipratropium-albuterol (DUONEB) 0.5-2.5 (3) MG/3ML SOLN Take 3 mLs by nebulization every 4 (four) hours as needed (wheezing, coughing, SOB). 05/12/17   Johnson, Clanford L, MD  losartan (COZAAR) 50 MG tablet Take 1 tablet (50 mg total) by mouth daily. 05/12/17 06/11/17  Johnson, Clanford L, MD  oxyCODONE-acetaminophen (PERCOCET) 5-325 MG tablet Take 1 tablet by mouth every 8 (eight) hours as needed for severe pain. 05/23/17   Ripley Fraise, MD  potassium chloride SA (K-DUR,KLOR-CON) 20 MEQ tablet Take 2 tablets (40 mEq total) by mouth daily. 05/12/17 06/11/17  Johnson, Clanford L, MD  predniSONE (DELTASONE) 20 MG tablet Take 3 PO QAM x3days, 2 PO QAM x3days, 1 PO QAM x3days 05/12/17   Murlean Iba, MD     Allergies:     Allergies  Allergen Reactions  . Lisinopril Hives    REACTION: Breaks out in whelts  . Propoxyphene N-Acetaminophen Itching     Physical Exam:   Vitals  Blood pressure 132/79, pulse 71, temperature (!) 100.5 F (38.1 C), temperature source Rectal, resp. rate 18, height 5\' 6"  (1.676 m), weight 96.2 kg (212 lb), SpO2 100 %.  1.  General: Patient is currently on BiPAP  2. Psychiatric:  Intact judgement and  insight, awake alert,  oriented x 3.  3. Neurologic: No focal neurological deficits, all cranial nerves intact.Strength 5/5 all 4 extremities, sensation intact all 4 extremities, plantars down going.  4. Eyes :  anicteric sclerae, moist conjunctivae with no lid lag. PERRLA.  5. ENMT:  Oropharynx clear with moist mucous membranes and good dentition  6. Neck:  supple, no cervical lymphadenopathy appriciated, No thyromegaly  7. Respiratory : Normal respiratory effort, decreased breath sounds bilaterally  8. Cardiovascular : RRR, no gallops, rubs or murmurs, no leg edema  9. Gastrointestinal:  Positive bowel sounds, abdomen soft, non-tender to palpation,no hepatosplenomegaly, no rigidity or guarding       10. Skin:  No cyanosis, normal texture and turgor, no rash, lesions or ulcers  11.Musculoskeletal:  Good muscle tone,  joints appear normal , no effusions,  normal range of motion    Data Review:    CBC Recent Labs  Lab 05/23/17 0004 05/24/17 1619  WBC 17.5* 17.3*  HGB 16.1* 16.7*  HCT 51.9* 52.3*  PLT 210 199  MCV 104.0* 102.5*  MCH 32.3 32.7  MCHC 31.0 31.9  RDW 13.1 13.0  LYMPHSABS 4.1* 2.4  MONOABS 1.7* 1.4*  EOSABS 0.2 0.0  BASOSABS 0.0 0.0   ------------------------------------------------------------------------------------------------------------------  Chemistries  Recent Labs  Lab 05/23/17 0004 05/24/17 1619  NA 140 136  K 3.3* 3.9  CL 96* 94*  CO2 36* 32  GLUCOSE 98 129*  BUN 14 15  CREATININE 0.82 0.65  CALCIUM 8.8* 9.0  AST  --  17  ALT  --  32  ALKPHOS  --  71  BILITOT  --  1.2   ------------------------------------------------------------------------------------------------------------------  ------------------------------------------------------------------------------------------------------------------ GFR: Estimated Creatinine Clearance: 92.9 mL/min (by C-G formula based on SCr of 0.65 mg/dL). Liver Function Tests: Recent Labs  Lab 05/24/17 1619    AST 17  ALT 32  ALKPHOS 71  BILITOT 1.2  PROT 6.1*  ALBUMIN 3.4*   Recent Labs  Lab 05/24/17 1619  LIPASE 27   Recent Labs  Lab 05/24/17 1621  AMMONIA 42*   Coagulation Profile: No results for input(s): INR, PROTIME in the last 168 hours. Cardiac Enzymes: No results for input(s): CKTOTAL, CKMB, CKMBINDEX, TROPONINI in the last 168 hours. BNP (last 3  results) No results for input(s): PROBNP in the last 8760 hours. HbA1C: No results for input(s): HGBA1C in the last 72 hours. CBG: No results for input(s): GLUCAP in the last 168 hours. Lipid Profile: No results for input(s): CHOL, HDL, LDLCALC, TRIG, CHOLHDL, LDLDIRECT in the last 72 hours. Thyroid Function Tests: No results for input(s): TSH, T4TOTAL, FREET4, T3FREE, THYROIDAB in the last 72 hours. Anemia Panel: No results for input(s): VITAMINB12, FOLATE, FERRITIN, TIBC, IRON, RETICCTPCT in the last 72 hours.  1   Imaging Results:    Ct Head Wo Contrast  Result Date: 05/24/2017 CLINICAL DATA:  Altered level of consciousness EXAM: CT HEAD WITHOUT CONTRAST TECHNIQUE: Contiguous axial images were obtained from the base of the skull through the vertex without intravenous contrast. COMPARISON:  None. FINDINGS: Brain: Mild atrophic changes and chronic white matter ischemic change are seen. No findings to suggest acute hemorrhage, acute infarction or space-occupying mass lesion are noted. Vascular: No hyperdense vessel or unexpected calcification. Skull: Normal. Negative for fracture or focal lesion. Sinuses/Orbits: No acute finding. Other: None. IMPRESSION: Mild atrophic and ischemic changes without acute abnormality. Electronically Signed   By: Inez Catalina M.D.   On: 05/24/2017 16:13   Ct Lumbar Spine Wo Contrast  Result Date: 05/23/2017 CLINICAL DATA:  57 y/o  F; left hip pain radiating to the left foot. EXAM: CT LUMBAR SPINE WITHOUT CONTRAST TECHNIQUE: Multidetector CT imaging of the lumbar spine was performed without  intravenous contrast administration. Multiplanar CT image reconstructions were also generated. COMPARISON:  01/09/2010 CT abdomen and pelvis. FINDINGS: Segmentation: 5 lumbar type vertebrae. Alignment: Normal. Vertebrae: No acute fracture or focal pathologic process. Paraspinal and other soft tissues: Calcific aortic atherosclerosis. Disc levels: Vertebral body and disc space heights are preserved. L1-2 small central disc protrusion. No significant foraminal or canal stenosis. IMPRESSION: 1. No acute fracture or malalignment. 2. L1-2 small central disc protrusion. 3. No significant foraminal or canal stenosis. Vertebral body and disc space heights are preserved. Electronically Signed   By: Kristine Garbe M.D.   On: 05/23/2017 00:09   Dg Chest Portable 1 View  Result Date: 05/24/2017 CLINICAL DATA:  RESPIRATORY FAILURE, PER ER NOTE, Pt states she was here Friday for same. Complaining of severe lower back pain and left leg pain. PATIENT CURRENTLY ON BYPAP HISTORY OF METHAMPHETAMINE DEPENDENCE, ETOH ABUSE EXAM: PORTABLE CHEST 1 VIEW COMPARISON:  05/11/2017 FINDINGS: Heart is enlarged. There are no focal consolidations or pleural effusions. No pulmonary edema. IMPRESSION: Stable cardiomegaly. Electronically Signed   By: Nolon Nations M.D.   On: 05/24/2017 18:19   Dg Knee Complete 4 Views Left  Result Date: 05/23/2017 CLINICAL DATA:  57 year old female with left knee pain. EXAM: LEFT KNEE - COMPLETE 4+ VIEW COMPARISON:  None. FINDINGS: No evidence of fracture, dislocation, or joint effusion. No evidence of arthropathy or other focal bone abnormality. Soft tissues are unremarkable. IMPRESSION: Negative. Electronically Signed   By: Anner Crete M.D.   On: 05/23/2017 00:38   Dg Hip Unilat W Or Wo Pelvis 2-3 Views Left  Result Date: 05/23/2017 CLINICAL DATA:  57 year old female with left lower extremity pain. EXAM: DG HIP (WITH OR WITHOUT PELVIS) 2-3V LEFT COMPARISON:  None. FINDINGS: There is no  acute fracture or dislocation. The bones are osteopenic. No significant arthritic changes. The soft tissues appear unremarkable. IMPRESSION: Negative. Electronically Signed   By: Anner Crete M.D.   On: 05/23/2017 00:37      Assessment & Plan:    Active Problems:  Essential hypertension   Acute respiratory failure ([place])   1. Acute on chronic hypercapnic respiratory failure-patient has underlying COPD, PCO2 is 68.0 on ABG.  Continue BiPAP.  This is likely worsening from taking Percocet and lorazepam.  We will continue to monitor patient closely in stepdown unit. 2. Altered mental status-due to medications including lorazepam, Percocet with underlying COPD.  Patient is slowly improving on BiPAP. 3. Chronic systolic CHF-euvolemic, continue Lasix 40 mg p.o. Daily 4. Hypertension-continue Cozaar, Coreg 5. Leukocytosis-likely from taking prednisone, she does have low-grade temperature.  But no other source of infection.  Chest x-ray shows no infiltrate, UA is clear.  We will continue to monitor.  Follow WBC in a.m. 6. Left leg pain-patient has been complaining of left calf pain, will check venous Doppler of lower extremities to rule out DVT.   DVT Prophylaxis-   Lovenox   AM Labs Ordered, also please review Full Orders  Family Communication: Admission, patients condition and plan of care including tests being ordered have been discussed with the patient and her son at bedside who indicate understanding and agree with the plan and Code Status.  Code Status:  Full code  Admission status: Inpatient    Time spent in minutes : 60 min   Oswald Hillock M.D on 05/24/2017 at 7:10 PM  Between 7am to 7pm - Pager - [phone]. After 7pm go to [email or web address] - password [facility]  [facility] - Office  [phone]

## 2017-05-24 NOTE — ED Notes (Signed)
CRITICAL VALUE ALERT  Critical Value: PCO2 68.0  Date & Time Notied:  05/24/2017 @1700   Provider Notified: Dr Jeanell Sparrow  Orders Received/Actions taken: Orders to be given.

## 2017-05-24 NOTE — ED Notes (Signed)
Resp paged for repeat ABG.

## 2017-05-24 NOTE — ED Provider Notes (Signed)
[facility] EMERGENCY DEPARTMENT Provider Note   CSN: [id number] Arrival date & time: 05/24/17  1445     History   Chief Complaint Chief Complaint  Patient presents with  . Back Pain    HPI [name] is a 57 y.o. female. Level 5 caveat secondary to decreased responsiveness Son, [name] is giving history HPI 57 yo female ho respiratrory failure due to chf and copd on home oxygen wjith diagnosis d/c 5/6 and had not had regular medical care.  All meds started at time of this d/c.  She is home with two sons living with her.  She was seen here 2 days ago (4 days post discharge) with complaints of left hip and leg pain.  Work up at that time did not reveal acute abnormality and patient was discharged with rx for 2 percocet.  Now with decreased responsiveness.  Son reports that she was up and about yesterday, although still complaining of pain in leg. Back pain is chronic but leg pain is new.  Today, not up at all and difficult to arouse. Son was able to bring her in by private vehicle with difficulty.  Past Medical History:  Diagnosis Date  . Anxiety   . ETOH abuse   . Hemorrhoids   . 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 COPD with acute exacerbation ([place]) 05/09/2017  . Essential hypertension 05/09/2017  . Acute systolic CHF (congestive heart failure) ([place]) 05/09/2017  . Acute respiratory failure with hypoxia ([place]) 05/09/2017  . ALCOHOL ABUSE, HX OF 01/30/2010  . PANCREATITIS, HX OF 01/30/2010    Past Surgical History:  Procedure Laterality Date  . CHOLECYSTECTOMY  90's  . ERCP     in the past with Dr.Rourk for CBD stone?  . TUBAL LIGATION       OB History    Gravida  4   Para  3   Term  3   Preterm      AB  1   Living        SAB  1   TAB      Ectopic      Multiple      Live Births               Home Medications    Prior to Admission medications   Medication Sig  Start Date End Date Taking? Authorizing Provider  albuterol (PROVENTIL HFA;VENTOLIN HFA) 108 (90 Base) MCG/ACT inhaler Inhale 2 puffs into the lungs every 4 (four) hours as needed for wheezing or shortness of breath (cough, shortness of breath or wheezing.). 05/12/17   Murlean Iba, MD  aspirin EC 325 MG tablet Take 325 mg by mouth daily as needed for mild pain.    [provider]  carvedilol (COREG) 6.25 MG tablet Take 1 tablet (6.25 mg total) by mouth 2 (two) times daily with a meal. 05/12/17 06/11/17  Johnson, Clanford L, MD  furosemide (LASIX) 40 MG tablet Take 1 tablet (40 mg total) by mouth daily. 05/12/17 06/11/17  Johnson, Clanford L, MD  ipratropium-albuterol (DUONEB) 0.5-2.5 (3) MG/3ML SOLN Take 3 mLs by nebulization every 4 (four) hours as needed (wheezing, coughing, SOB). 05/12/17   Johnson, Clanford L, MD  losartan (COZAAR) 50 MG tablet Take 1 tablet (50 mg total) by mouth daily. 05/12/17 06/11/17  Murlean Iba, MD  oxyCODONE-acetaminophen (PERCOCET) 5-325 MG tablet Take 1 tablet  by mouth every 8 (eight) hours as needed for severe pain. 05/23/17   Ripley Fraise, MD  potassium chloride SA (K-DUR,KLOR-CON) 20 MEQ tablet Take 2 tablets (40 mEq total) by mouth daily. 05/12/17 06/11/17  Johnson, Clanford L, MD  predniSONE (DELTASONE) 20 MG tablet Take 3 PO QAM x3days, 2 PO QAM x3days, 1 PO QAM x3days 05/12/17   Murlean Iba, MD    Family History History reviewed. No pertinent family history.  Social History Social History   Tobacco Use  . Smoking status: Current Every Day Smoker    Packs/day: 0.50    Types: Cigarettes  . Smokeless tobacco: Never Used  . Tobacco comment: 2 a day per pt report  Substance Use Topics  . Alcohol use: Yes    Comment: occas  . Drug use: Never     Allergies   Lisinopril and Propoxyphene n-acetaminophen   Review of Systems Review of Systems  Unable to perform ROS: Mental status change     Physical Exam Updated Vital  Signs BP 129/78 (BP Location: Right Arm)   Pulse 95   Temp 98.5 F (36.9 C) (Oral)   Resp 19   Ht 1.676 m (5\' 6" )   Wt 96.2 kg (212 lb)   SpO2 94%   BMI 34.22 kg/m   Physical Exam  Constitutional: She appears well-developed and well-nourished. She appears distressed.  HENT:  Head: Normocephalic and atraumatic.  Right Ear: External ear normal.  Left Ear: External ear normal.  Nose: Nose normal.  Mucous membranes appear very dry  Eyes: Pupils are equal, round, and reactive to light. Conjunctivae and EOM are normal.  Neck: Normal range of motion. Neck supple.  Cardiovascular: Normal rate, regular rhythm and intact distal pulses.  Pulmonary/Chest:  Increased wob, decreased bs throughout  Abdominal: Soft. Bowel sounds are normal.  Musculoskeletal: Normal range of motion. She exhibits no tenderness or deformity.  Neurological: She is alert. A cranial nerve deficit is present. Coordination normal.  Patient aro to verbal stimuli-  Generally weak Oriented to place and day of week  Skin: Skin is warm and dry. Capillary refill takes less than 2 seconds.  Nursing note and vitals reviewed.    ED Treatments / Results  Labs (all labs ordered are listed, but only abnormal results are displayed) Labs Reviewed  CBC WITH DIFFERENTIAL/PLATELET  COMPREHENSIVE METABOLIC PANEL  BLOOD GAS, ARTERIAL  RAPID URINE DRUG SCREEN, HOSP PERFORMED  ETHANOL  URINALYSIS, ROUTINE W REFLEX MICROSCOPIC  AMMONIA  LIPASE, BLOOD    EKG None  Radiology Ct Lumbar Spine Wo Contrast  Result Date: 05/23/2017 CLINICAL DATA:  57 y/o  F; left hip pain radiating to the left foot. EXAM: CT LUMBAR SPINE WITHOUT CONTRAST TECHNIQUE: Multidetector CT imaging of the lumbar spine was performed without intravenous contrast administration. Multiplanar CT image reconstructions were also generated. COMPARISON:  01/09/2010 CT abdomen and pelvis. FINDINGS: Segmentation: 5 lumbar type vertebrae. Alignment: Normal.  Vertebrae: No acute fracture or focal pathologic process. Paraspinal and other soft tissues: Calcific aortic atherosclerosis. Disc levels: Vertebral body and disc space heights are preserved. L1-2 small central disc protrusion. No significant foraminal or canal stenosis. IMPRESSION: 1. No acute fracture or malalignment. 2. L1-2 small central disc protrusion. 3. No significant foraminal or canal stenosis. Vertebral body and disc space heights are preserved. Electronically Signed   By: Kristine Garbe M.D.   On: 05/23/2017 00:09   Dg Knee Complete 4 Views Left  Result Date: 05/23/2017 CLINICAL DATA:  57 year old female with left  knee pain. EXAM: LEFT KNEE - COMPLETE 4+ VIEW COMPARISON:  None. FINDINGS: No evidence of fracture, dislocation, or joint effusion. No evidence of arthropathy or other focal bone abnormality. Soft tissues are unremarkable. IMPRESSION: Negative. Electronically Signed   By: Anner Crete M.D.   On: 05/23/2017 00:38   Dg Hip Unilat W Or Wo Pelvis 2-3 Views Left  Result Date: 05/23/2017 CLINICAL DATA:  57 year old female with left lower extremity pain. EXAM: DG HIP (WITH OR WITHOUT PELVIS) 2-3V LEFT COMPARISON:  None. FINDINGS: There is no acute fracture or dislocation. The bones are osteopenic. No significant arthritic changes. The soft tissues appear unremarkable. IMPRESSION: Negative. Electronically Signed   By: Anner Crete M.D.   On: 05/23/2017 00:37    Procedures Procedures (including critical care time)  Medications Ordered in ED Medications  sodium chloride 0.9 % bolus 1,000 mL (has no administration in time range)     Initial Impression / Assessment and Plan / ED Course  I have reviewed the triage vital signs and the nursing notes.  Pertinent labs & imaging results that were available during my care of the patient were reviewed by me and considered in my medical decision making (see chart for details).  Clinical Course as of May 24 1716  Sun May 24, 2017  1716 ABG reviewed and elevated CO2 at 68 Likely contributory to patient's decreased mental status.Plan BiPAP with albuterol and Solu-Medrol   [DR]    Clinical Course User Index [DR] Pattricia Boss, MD   5:16 PM 1- AMS- respiratory failure with co narcosis, vs od (patient with benzos in urine) and percocet rx, fever- no evidence for cns infection, ammonia slighlty elevated 2- respiratory failure - on bipap here 3-fever 100.5- no definite source, bl cxs pending-no antibiotics at this time  Repeat ABG pending at this time. Discussed with Dr. Darrick Meigs and he will see for admission  CRITICAL CARE Performed by: Pattricia Boss Total critical care time: 60 minutes Critical care time was exclusive of separately billable procedures and treating other patients. Critical care was necessary to treat or prevent imminent or life-threatening deterioration. Critical care was time spent personally by me on the following activities: development of treatment plan with patient and/or surrogate as well as nursing, discussions with consultants, evaluation of patient's response to treatment, examination of patient, obtaining history from patient or surrogate, ordering and performing treatments and interventions, ordering and review of laboratory studies, ordering and review of radiographic studies, pulse oximetry and re-evaluation of patient's condition.   Final Clinical Impressions(s) / ED Diagnoses   Final diagnoses:  Acute on chronic respiratory failure with hypercapnia [facility])    ED Discharge Orders    None       Pattricia Boss, MD 05/24/17 1859

## 2017-05-24 NOTE — ED Triage Notes (Signed)
Pt states she was here Friday for same. Complaining of severe lower back pain and left leg pain.

## 2017-05-25 ENCOUNTER — Inpatient Hospital Stay (HOSPITAL_COMMUNITY): Payer: BLUE CROSS/BLUE SHIELD

## 2017-05-25 DIAGNOSIS — M549 Dorsalgia, unspecified: Secondary | ICD-10-CM | POA: Diagnosis present

## 2017-05-25 DIAGNOSIS — G9341 Metabolic encephalopathy: Secondary | ICD-10-CM | POA: Diagnosis present

## 2017-05-25 DIAGNOSIS — M79662 Pain in left lower leg: Secondary | ICD-10-CM

## 2017-05-25 DIAGNOSIS — G8929 Other chronic pain: Secondary | ICD-10-CM

## 2017-05-25 DIAGNOSIS — I10 Essential (primary) hypertension: Secondary | ICD-10-CM

## 2017-05-25 DIAGNOSIS — I5032 Chronic diastolic (congestive) heart failure: Secondary | ICD-10-CM | POA: Diagnosis present

## 2017-05-25 DIAGNOSIS — M545 Low back pain: Secondary | ICD-10-CM

## 2017-05-25 DIAGNOSIS — J449 Chronic obstructive pulmonary disease, unspecified: Secondary | ICD-10-CM | POA: Diagnosis present

## 2017-05-25 DIAGNOSIS — J9622 Acute and chronic respiratory failure with hypercapnia: Principal | ICD-10-CM | POA: Diagnosis present

## 2017-05-25 LAB — COMPREHENSIVE METABOLIC PANEL
ALT: 31 U/L (ref 14–54)
ANION GAP: 10 (ref 5–15)
AST: 16 U/L (ref 15–41)
Albumin: 3.2 g/dL — ABNORMAL LOW (ref 3.5–5.0)
Alkaline Phosphatase: 75 U/L (ref 38–126)
BUN: 14 mg/dL (ref 6–20)
CO2: 33 mmol/L — AB (ref 22–32)
Calcium: 9 mg/dL (ref 8.9–10.3)
Chloride: 93 mmol/L — ABNORMAL LOW (ref 101–111)
Creatinine, Ser: 0.78 mg/dL (ref 0.44–1.00)
Glucose, Bld: 157 mg/dL — ABNORMAL HIGH (ref 65–99)
Potassium: 4.5 mmol/L (ref 3.5–5.1)
SODIUM: 136 mmol/L (ref 135–145)
Total Bilirubin: 1.7 mg/dL — ABNORMAL HIGH (ref 0.3–1.2)
Total Protein: 6.1 g/dL — ABNORMAL LOW (ref 6.5–8.1)

## 2017-05-25 LAB — MRSA PCR SCREENING: MRSA by PCR: NEGATIVE

## 2017-05-25 LAB — CBC
HCT: 53.2 % — ABNORMAL HIGH (ref 36.0–46.0)
Hemoglobin: 16.4 g/dL — ABNORMAL HIGH (ref 12.0–15.0)
MCH: 31.5 pg (ref 26.0–34.0)
MCHC: 30.8 g/dL (ref 30.0–36.0)
MCV: 102.1 fL — AB (ref 78.0–100.0)
PLATELETS: 212 10*3/uL (ref 150–400)
RBC: 5.21 MIL/uL — ABNORMAL HIGH (ref 3.87–5.11)
RDW: 13.1 % (ref 11.5–15.5)
WBC: 13 10*3/uL — ABNORMAL HIGH (ref 4.0–10.5)

## 2017-05-25 MED ORDER — KETOROLAC TROMETHAMINE 30 MG/ML IJ SOLN
30.0000 mg | Freq: Four times a day (QID) | INTRAMUSCULAR | Status: DC
  Administered 2017-05-25 – 2017-05-26 (×6): 30 mg via INTRAVENOUS
  Filled 2017-05-25 (×6): qty 1

## 2017-05-25 MED ORDER — GUAIFENESIN-DM 100-10 MG/5ML PO SYRP
5.0000 mL | ORAL_SOLUTION | ORAL | Status: DC | PRN
  Administered 2017-05-25 – 2017-05-26 (×2): 5 mL via ORAL
  Filled 2017-05-25 (×2): qty 5

## 2017-05-25 MED ORDER — GADOBENATE DIMEGLUMINE 529 MG/ML IV SOLN
20.0000 mL | Freq: Once | INTRAVENOUS | Status: AC | PRN
  Administered 2017-05-25: 20 mL via INTRAVENOUS

## 2017-05-25 NOTE — Progress Notes (Signed)
PROGRESS NOTE    [name]  YNW:[id number] DOB: 01-28-1960 DOA: 05/24/2017 PCP: Celene Squibb, MD    Brief Narrative:  57 y/o female with history of COPD, HTN, chronic back pain, presents to the hospital with lethargy and worsening left leg pain. She has taken pain medications as well as benzos. She was found to have hypercapneic respiratory failure and was briefly placed on bipap. Overall mental status has improved and respiratory status appears stable. She is back on nasal cannula. Work up for leg and back pain underway   Assessment & Plan:   Active Problems:   Essential hypertension   COPD (chronic obstructive pulmonary disease) (HCC)   Acute on chronic respiratory failure with hypercapnia (H[facility])   Back pain   Acute metabolic encephalopathy   1. Acute metabolic encephalopathy. Related to hypercapnia in the setting of benzo/opiod use. She was briefly placed on bipap. Follow up pCO2 is better. Mental status is now back to baseline. 2. Acute on chronic resp failure with hypercapnia. Related to COPD and concurrent polypharmacy. She was briefly placed on bipap, but is now back on nasal cannula. Continue bronchodilators. 3. COPD. No wheezing at this time. Continue on neb treatments 4. Left leg pain. Described as pins and needles and present for the last month, worse over the last few days. Venous dopplers to rule out DVT has been ordered. Plain films done in the last few days was unrevealing. 5. Low back pain. Present for several years now. Patient feels that this is affecting her ability to function. Will check MRI of lumbar spine. 6. HTN. Stable on losartan. Continue to monitor.  7. Chronic diastolic chf. Appears compensated. Continue home dose of lasix.   DVT prophylaxis: lovenox Code Status: full code Family Communication: discussed with son at the bedside Disposition Plan: discharge home once improved, pending physical therapy evaluation   Consultants:     Procedures:      Antimicrobials:       Subjective: Continues to have intermittent back pain and intermittent left leg pain.  Left leg pain begins below the knee and is down to her foot.  Back pain is in her lower back.  Overall breathing is better.  Mental status is now back to baseline.  Objective: Vitals:   05/25/17 0500 05/25/17 0600 05/25/17 0759 05/25/17 0833  BP:  122/83    Pulse: 66 (!) 59 85   Resp: 19 20 (!) 23   Temp:      TempSrc:      SpO2: 90% 93% 100% 99%  Weight: 98.5 kg (217 lb 2.5 oz)     Height:        Intake/Output Summary (Last 24 hours) at 05/25/2017 1051 Last data filed at 05/24/2017 1728 Gross per 24 hour  Intake 933.33 ml  Output -  Net 933.33 ml   Filed Weights   05/24/17 1453 05/24/17 2200 05/25/17 0500  Weight: 96.2 kg (212 lb) 98 kg (216 lb 0.8 oz) 98.5 kg (217 lb 2.5 oz)    Examination:  General exam: Appears calm and comfortable  Respiratory system: Clear to auscultation. Respiratory effort normal. Cardiovascular system: S1 & S2 heard, RRR. No JVD, murmurs, rubs, gallops or clicks. No pedal edema. Gastrointestinal system: Abdomen is nondistended, soft and nontender. No organomegaly or masses felt. Normal bowel sounds heard. Central nervous system: Alert and oriented. No focal neurological deficits. Extremities: Symmetric 5 x 5 power. Pedal pulses intact Skin: No rashes, lesions or ulcers Psychiatry: Judgement and insight appear  normal. Mood & affect appropriate.     Data Reviewed: I have personally reviewed following labs and imaging studies  CBC: Recent Labs  Lab 05/23/17 0004 05/24/17 1619 05/25/17 0424  WBC 17.5* 17.3* 13.0*  NEUTROABS 11.5* 13.5*  --   HGB 16.1* 16.7* 16.4*  HCT 51.9* 52.3* 53.2*  MCV 104.0* 102.5* 102.1*  PLT 210 199 671   Basic Metabolic Panel: Recent Labs  Lab 05/23/17 0004 05/24/17 1619 05/25/17 0424  NA 140 136 136  K 3.3* 3.9 4.5  CL 96* 94* 93*  CO2 36* 32 33*  GLUCOSE 98 129* 157*  BUN 14 15 14    CREATININE 0.82 0.65 0.78  CALCIUM 8.8* 9.0 9.0   GFR: Estimated Creatinine Clearance: 94.1 mL/min (by C-G formula based on SCr of 0.78 mg/dL). Liver Function Tests: Recent Labs  Lab 05/24/17 1619 05/25/17 0424  AST 17 16  ALT 32 31  ALKPHOS 71 75  BILITOT 1.2 1.7*  PROT 6.1* 6.1*  ALBUMIN 3.4* 3.2*   Recent Labs  Lab 05/24/17 1619  LIPASE 27   Recent Labs  Lab 05/24/17 1621  AMMONIA 42*   Coagulation Profile: No results for input(s): INR, PROTIME in the last 168 hours. Cardiac Enzymes: No results for input(s): CKTOTAL, CKMB, CKMBINDEX, TROPONINI in the last 168 hours. BNP (last 3 results) No results for input(s): PROBNP in the last 8760 hours. HbA1C: No results for input(s): HGBA1C in the last 72 hours. CBG: No results for input(s): GLUCAP in the last 168 hours. Lipid Profile: No results for input(s): CHOL, HDL, LDLCALC, TRIG, CHOLHDL, LDLDIRECT in the last 72 hours. Thyroid Function Tests: No results for input(s): TSH, T4TOTAL, FREET4, T3FREE, THYROIDAB in the last 72 hours. Anemia Panel: No results for input(s): VITAMINB12, FOLATE, FERRITIN, TIBC, IRON, RETICCTPCT in the last 72 hours. Sepsis Labs: Recent Labs  Lab 05/24/17 1632  LATICACIDVEN 1.19    Recent Results (from the past 240 hour(s))  Blood culture (routine x 2)     Status: None (Preliminary result)   Collection Time: 05/24/17  4:22 PM  Result Value Ref Range Status   Specimen Description   Final    RIGHT ANTECUBITAL BOTTLES DRAWN AEROBIC AND ANAEROBIC   Special Requests Blood Culture adequate volume  Final   Culture   Final    NO GROWTH < 24 HOURS Performed at [facility], [address], [place], [place] [address]    Report Status PENDING  Incomplete  Blood culture (routine x 2)     Status: None (Preliminary result)   Collection Time: 05/24/17  4:28 PM  Result Value Ref Range Status   Specimen Description   Final    BLOOD RIGHT ARM BOTTLES DRAWN AEROBIC AND ANAEROBIC   Special  Requests Blood Culture adequate volume  Final   Culture   Final    NO GROWTH < 24 HOURS Performed at [facility], [address] [place], [place] [address]    Report Status PENDING  Incomplete  MRSA PCR Screening     Status: None   Collection Time: 05/24/17  9:05 PM  Result Value Ref Range Status   MRSA by PCR NEGATIVE NEGATIVE Final    Comment:        The GeneXpert MRSA Assay (FDA approved for NASAL specimens only), is one component of a comprehensive MRSA colonization surveillance program. It is not intended to diagnose MRSA infection nor to guide or monitor treatment for MRSA infections. Performed at [facility], [address], [place], [place]  [address]          Radiology Studies: Ct Head Wo Contrast  Result Date: 05/24/2017 CLINICAL DATA:  Altered level of consciousness EXAM: CT HEAD WITHOUT CONTRAST TECHNIQUE: Contiguous axial images were obtained from the base of the skull through the vertex without intravenous contrast. COMPARISON:  None. FINDINGS: Brain: Mild atrophic changes and chronic white matter ischemic change are seen. No findings to suggest acute hemorrhage, acute infarction or space-occupying mass lesion are noted. Vascular: No hyperdense vessel or unexpected calcification. Skull: Normal. Negative for fracture or focal lesion. Sinuses/Orbits: No acute finding. Other: None. IMPRESSION: Mild atrophic and ischemic changes without acute abnormality. Electronically Signed   By: Inez Catalina M.D.   On: 05/24/2017 16:13   Dg Chest Portable 1 View  Result Date: 05/24/2017 CLINICAL DATA:  RESPIRATORY FAILURE, PER ER NOTE, Pt states she was here Friday for same. Complaining of severe lower back pain and left leg pain. PATIENT CURRENTLY ON BYPAP HISTORY OF METHAMPHETAMINE DEPENDENCE, ETOH ABUSE EXAM: PORTABLE CHEST 1 VIEW COMPARISON:  05/11/2017 FINDINGS: Heart is enlarged. There are no focal consolidations or pleural effusions. No pulmonary edema. IMPRESSION: Stable  cardiomegaly. Electronically Signed   By: Nolon Nations M.D.   On: 05/24/2017 18:19        Scheduled Meds: . carvedilol  6.25 mg Oral BID WC  . enoxaparin (LOVENOX) injection  40 mg Subcutaneous Q24H  . furosemide  40 mg Oral Daily  . ipratropium-albuterol  3 mL Nebulization Q6H  . losartan  50 mg Oral Daily  . sodium chloride flush  3 mL Intravenous Q12H   Continuous Infusions: . sodium chloride       LOS: 1 day    Time spent: 43mins    Kathie Dike, MD [facility] Pager [phone]  If 7PM-7AM, please contact night-coverage [email or web address] Password [facility] 05/25/2017, 10:51 AM

## 2017-05-25 NOTE — Evaluation (Signed)
Physical Therapy Evaluation Patient Details Name: [name] MRN: [id number] DOB: Oct 25, 1960 Today's Date: 05/25/2017   History of Present Illness  [name]  is a 57 y.o. female, with history of COPD, chronic systolic CHF on home oxygen was brought to hospital with altered mental status.  Patient lives home with her son.  2 days ago she was seen in the ED with left leg pain and was discharged home on Percocet.  As per patient's son she was also given lorazepam by her aunt who was visiting.  Patient might have taken lorazepam along with Percocet and was found to be somnolent today.  She also had some difficulty breathing.    Clinical Impression  Patient functioning at baseline for functional mobility and gait.  Plan: patient discharged from physical therapy to care of nursing for ambulation daily as tolerated for length of stay.    Follow Up Recommendations No PT follow up    Equipment Recommendations  None recommended by PT    Recommendations for Other Services       Precautions / Restrictions Precautions Precautions: None Restrictions Weight Bearing Restrictions: No      Mobility  Bed Mobility Overal bed mobility: Modified Independent                Transfers Overall transfer level: Modified independent                  Ambulation/Gait Ambulation/Gait assistance: Modified independent (Device/Increase time) Ambulation Distance (Feet): 100 Feet Assistive device: Rolling walker (2 wheeled) Gait Pattern/deviations: WFL(Within Functional Limits) Gait velocity: decreased   General Gait Details: grossly WFL except slightly slower than normal cadence  Stairs            Wheelchair Mobility    Modified Rankin (Stroke Patients Only)       Balance Overall balance assessment: No apparent balance deficits (not formally assessed)                                           Pertinent Vitals/Pain Pain Assessment: 0-10 Pain Score: 5   Pain Location: low back Pain Descriptors / Indicators: Aching Pain Intervention(s): Limited activity within patient's tolerance;Monitored during session    Home Living Family/patient expects to be discharged to:: Private residence Living Arrangements: Children(2 sons) Available Help at Discharge: Family Type of Home: House Home Access: Level entry     Home Layout: One level Home Equipment: Environmental consultant - 2 wheels;Cane - single point Additional Comments: on home oxygen 24/7    Prior Function Level of Independence: Independent with assistive device(s)         Comments: household gait with SPC/RW PRN     Hand Dominance        Extremity/Trunk Assessment   Upper Extremity Assessment Upper Extremity Assessment: Overall WFL for tasks assessed    Lower Extremity Assessment Lower Extremity Assessment: Overall WFL for tasks assessed    Cervical / Trunk Assessment Cervical / Trunk Assessment: Normal  Communication   Communication: No difficulties  Cognition Arousal/Alertness: Awake/alert Behavior During Therapy: WFL for tasks assessed/performed Overall Cognitive Status: Within Functional Limits for tasks assessed                                        General Comments  Exercises     Assessment/Plan    PT Assessment Patent does not need any further PT services  PT Problem List         PT Treatment Interventions      PT Goals (Current goals can be found in the Care Plan section)  Acute Rehab PT Goals Patient Stated Goal: return home PT Goal Formulation: With patient/family Time For Goal Achievement: 2017/05/29 Potential to Achieve Goals: Good    Frequency     Barriers to discharge        Co-evaluation               AM-PAC PT "6 Clicks" Daily Activity  Outcome Measure Difficulty turning over in bed (including adjusting bedclothes, sheets and blankets)?: None Difficulty moving from lying on back to sitting on the side of the bed?  : None Difficulty sitting down on and standing up from a chair with arms (e.g., wheelchair, bedside commode, etc,.)?: None Help needed moving to and from a bed to chair (including a wheelchair)?: None Help needed walking in hospital room?: None Help needed climbing 3-5 steps with a railing? : A Little 6 Click Score: 23    End of Session Equipment Utilized During Treatment: Gait belt Activity Tolerance: Patient tolerated treatment well Patient left: in chair;with call bell/phone within reach;with family/visitor present Nurse Communication: Mobility status;Other (comment)(RN notified patient left up in chair and RW left in room for patient to use ) PT Visit Diagnosis: Unsteadiness on feet (R26.81);Other abnormalities of gait and mobility (R26.89);Muscle weakness (generalized) (M62.81)    Time: 1451-1520 PT Time Calculation (min) (ACUTE ONLY): 29 min   Charges:   PT Evaluation $PT Eval Low Complexity: 1 Low PT Treatments $Therapeutic Activity: 23-37 mins   PT G Codes:        4:05 PM, 05/29/17 Lonell Grandchild, MPT Physical Therapist with [facility] [phone] office [phone] mobile phone

## 2017-05-25 NOTE — Progress Notes (Signed)
Pt taken off BIPAP and placed on 3l nasal cannula.  Pt tolerating well at this time.  RT will continue to monitor.

## 2017-05-25 NOTE — Progress Notes (Signed)
Patient should be able to come off BiPAP once she wakes up from her etoh and drug induced sleep. Probably runs higher with PCO2 in 50's. She still smokes and is on home Oxygen. Has congested cough.

## 2017-05-26 ENCOUNTER — Inpatient Hospital Stay (HOSPITAL_COMMUNITY): Payer: BLUE CROSS/BLUE SHIELD

## 2017-05-26 LAB — BASIC METABOLIC PANEL
Anion gap: 8 (ref 5–15)
BUN: 28 mg/dL — AB (ref 6–20)
CALCIUM: 9 mg/dL (ref 8.9–10.3)
CO2: 36 mmol/L — AB (ref 22–32)
CREATININE: 0.91 mg/dL (ref 0.44–1.00)
Chloride: 93 mmol/L — ABNORMAL LOW (ref 101–111)
GFR calc non Af Amer: >60 mL/min (ref >60)
GLUCOSE: 86 mg/dL (ref 65–99)
Potassium: 3.6 mmol/L (ref 3.5–5.1)
Sodium: 137 mmol/L (ref 135–145)

## 2017-05-26 MED ORDER — GABAPENTIN 100 MG PO CAPS: 100.0000 mg | ORAL_CAPSULE | Freq: Three times a day (TID) | ORAL | 0 refills | Status: DC

## 2017-05-26 NOTE — Discharge Summary (Signed)
Physician Discharge Summary  [name] FKC:[id number] DOB: 1961-01-06 DOA: 05/24/2017  PCP: Celene Squibb, MD  Admit date: 05/24/2017 Discharge date: 05/26/2017  Admitted From: Home Disposition: Home  Recommendations for Outpatient Follow-up:  1. Follow up with PCP in 1-2 weeks 2. Please obtain BMP/CBC in one week 3. Follow-up with primary care physician next week as previously scheduled.  Home Health: Equipment/Devices:  Discharge Condition: Stable CODE STATUS: Full code Diet recommendation: Heart Healthy  Brief/Interim Summary: 57 year old female with a history of oxygen dependent COPD, hypertension chronic back pain, presented with lethargy and worsening left leg pain.  Patient had taken narcotic pain medication as well as benzos that she had received from a family member.  She was found to have hypercapnic respiratory failure and was briefly placed on BiPAP.  Sedative medications were held.  Her mental status improved and she was weaned off BiPAP to nasal cannula oxygen.  It is likely that her worsening mental status/respiratory status was related to polypharmacy.  She was advised to not take any medication that is not prescribed to her.  Her COPD appears stable at this time.  She does not have any wheezing.  She is on her baseline oxygen requirement and will continue on home oxygen as well as nebulizer treatments.  Regarding her back pain/leg pain.  She underwent MRI of her lumbar spine that was relatively unrevealing.  She also had venous Dopplers as well as ABIs that did not have any significant findings.  She is recently had plain films of her lower extremities done that did not show any acute process.  It is possible that her symptoms are related to a neuropathy.  She is been started on gabapentin.  Would likely benefit from neurology referral to be considered for outpatient EMGs.  Patient plans to follow-up with her primary care physician in the next week.  She was seen by physical  therapy and no further therapy was recommended.  She was able to ambulate independently.  Discharge Diagnoses:  Active Problems:   Essential hypertension   COPD (chronic obstructive pulmonary disease) (HCC)   Acute on chronic respiratory failure with hypercapnia (HCC)   Back pain   Acute metabolic encephalopathy   Chronic diastolic CHF (congestive heart failure) [facility])    Discharge Instructions  Discharge Instructions    Diet - low sodium heart healthy   Complete by:  As directed    Increase activity slowly   Complete by:  As directed      Allergies as of 05/26/2017      Reactions   Lisinopril Hives   REACTION: Breaks out in whelts   Propoxyphene N-acetaminophen Itching      Medication List    STOP taking these medications   predniSONE 20 MG tablet Commonly known as:  DELTASONE     TAKE these medications   albuterol 108 (90 Base) MCG/ACT inhaler Commonly known as:  PROVENTIL HFA;VENTOLIN HFA Inhale 2 puffs into the lungs every 4 (four) hours as needed for wheezing or shortness of breath (cough, shortness of breath or wheezing.).   aspirin EC 325 MG tablet Take 325 mg by mouth daily as needed for mild pain.   carvedilol 6.25 MG tablet Commonly known as:  COREG Take 1 tablet (6.25 mg total) by mouth 2 (two) times daily with a meal.   furosemide 40 MG tablet Commonly known as:  LASIX Take 1 tablet (40 mg total) by mouth daily.   gabapentin 100 MG capsule Commonly known as:  NEURONTIN Take 1 capsule (100 mg total) by mouth 3 (three) times daily.   ipratropium-albuterol 0.5-2.5 (3) MG/3ML Soln Commonly known as:  DUONEB Take 3 mLs by nebulization every 4 (four) hours as needed (wheezing, coughing, SOB).   losartan 50 MG tablet Commonly known as:  COZAAR Take 1 tablet (50 mg total) by mouth daily.   oxyCODONE-acetaminophen 5-325 MG tablet Commonly known as:  PERCOCET Take 1 tablet by mouth every 8 (eight) hours as needed for severe pain.   potassium  chloride SA 20 MEQ tablet Commonly known as:  K-DUR,KLOR-CON Take 2 tablets (40 mEq total) by mouth daily.      Follow-up Information    [facility].   Specialty:  Home Health Services Contact information: [place] [id number] [phone]          Allergies  Allergen Reactions  . Lisinopril Hives    REACTION: Breaks out in whelts  . Propoxyphene N-Acetaminophen Itching    Consultations:     Procedures/Studies: Ct Head Wo Contrast  Result Date: 05/24/2017 CLINICAL DATA:  Altered level of consciousness EXAM: CT HEAD WITHOUT CONTRAST TECHNIQUE: Contiguous axial images were obtained from the base of the skull through the vertex without intravenous contrast. COMPARISON:  None. FINDINGS: Brain: Mild atrophic changes and chronic white matter ischemic change are seen. No findings to suggest acute hemorrhage, acute infarction or space-occupying mass lesion are noted. Vascular: No hyperdense vessel or unexpected calcification. Skull: Normal. Negative for fracture or focal lesion. Sinuses/Orbits: No acute finding. Other: None. IMPRESSION: Mild atrophic and ischemic changes without acute abnormality. Electronically Signed   By: Inez Catalina M.D.   On: 05/24/2017 16:13   Ct Lumbar Spine Wo Contrast  Result Date: 05/23/2017 CLINICAL DATA:  57 y/o  F; left hip pain radiating to the left foot. EXAM: CT LUMBAR SPINE WITHOUT CONTRAST TECHNIQUE: Multidetector CT imaging of the lumbar spine was performed without intravenous contrast administration. Multiplanar CT image reconstructions were also generated. COMPARISON:  01/09/2010 CT abdomen and pelvis. FINDINGS: Segmentation: 5 lumbar type vertebrae. Alignment: Normal. Vertebrae: No acute fracture or focal pathologic process. Paraspinal and other soft tissues: Calcific aortic atherosclerosis. Disc levels: Vertebral body and disc space heights are preserved. L1-2 small central disc protrusion. No  significant foraminal or canal stenosis. IMPRESSION: 1. No acute fracture or malalignment. 2. L1-2 small central disc protrusion. 3. No significant foraminal or canal stenosis. Vertebral body and disc space heights are preserved. Electronically Signed   By: Kristine Garbe M.D.   On: 05/23/2017 00:09   Mr Lumbar Spine W Wo Contrast  Result Date: 05/25/2017 CLINICAL DATA:  Low back pain radiating to both legs for 6 weeks EXAM: MRI LUMBAR SPINE WITHOUT AND WITH CONTRAST TECHNIQUE: Multiplanar and multiecho pulse sequences of the lumbar spine were obtained without and with intravenous contrast. CONTRAST:  52mL MULTIHANCE GADOBENATE DIMEGLUMINE 529 MG/ML IV SOLN COMPARISON:  None. FINDINGS: Segmentation:  Standard. Alignment:  Physiologic. Vertebrae:  No fracture, evidence of discitis, or bone lesion. Conus medullaris and cauda equina: Conus extends to the L1 level. Conus and cauda equina appear normal. Paraspinal and other soft tissues: No acute paraspinal abnormality. 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 Disc levels: Disc spaces: Disc spaces are maintained. Disc desiccation throughout the lumbar spine. T12-L1: No significant disc bulge. No evidence of neural foraminal stenosis. No central canal stenosis. L1-L2: Central disc protrusion. No evidence of neural foraminal stenosis. No central canal  stenosis. L2-L3: Mild broad-based disc bulge. No evidence of neural foraminal stenosis. No central canal stenosis. L3-L4: Mild broad-based disc bulge. No evidence of neural foraminal stenosis. No central canal stenosis. L4-L5: Mild broad-based disc bulge. Mild bilateral facet arthropathy. No evidence of neural foraminal stenosis. No central canal stenosis. L5-S1: No significant disc bulge. No evidence of neural foraminal stenosis. No central canal stenosis. IMPRESSION: 1. Mild lumbar spine spondylosis as described. Electronically Signed   By: Kathreen Devoid   On: 05/25/2017 12:25   US  Venous Img Lower Bilateral  Result Date: 05/25/2017 CLINICAL DATA:  Calf pain EXAM: BILATERAL LOWER EXTREMITY VENOUS DOPPLER ULTRASOUND TECHNIQUE: Gray-scale sonography with graded compression, as well as color Doppler and duplex ultrasound were performed to evaluate the lower extremity deep venous systems from the level of the common femoral vein and including the common femoral, femoral, profunda femoral, popliteal and calf veins including the posterior tibial, peroneal and gastrocnemius veins when visible. The superficial great saphenous vein was also interrogated. Spectral Doppler was utilized to evaluate flow at rest and with distal augmentation maneuvers in the common femoral, femoral and popliteal veins. COMPARISON:  None. FINDINGS: RIGHT LOWER EXTREMITY Common Femoral Vein: No evidence of thrombus. Normal compressibility, respiratory phasicity and response to augmentation. Saphenofemoral Junction: No evidence of thrombus. Normal compressibility and flow on color Doppler imaging. Profunda Femoral Vein: No evidence of thrombus. Normal compressibility and flow on color Doppler imaging. Femoral Vein: No evidence of thrombus. Normal compressibility, respiratory phasicity and response to augmentation. Popliteal Vein: No evidence of thrombus. Normal compressibility, respiratory phasicity and response to augmentation. Calf Veins: No evidence of thrombus. Normal compressibility and flow on color Doppler imaging. Superficial Great Saphenous Vein: No evidence of thrombus. Normal compressibility. Venous Reflux:  None. Other Findings:  None. LEFT LOWER EXTREMITY Common Femoral Vein: No evidence of thrombus. Normal compressibility, respiratory phasicity and response to augmentation. Saphenofemoral Junction: No evidence of thrombus. Normal compressibility and flow on color Doppler imaging. Profunda Femoral Vein: No evidence of thrombus. Normal compressibility and flow on color Doppler imaging. Femoral Vein: No evidence  of thrombus. Normal compressibility, respiratory phasicity and response to augmentation. Popliteal Vein: No evidence of thrombus. Normal compressibility, respiratory phasicity and response to augmentation. Calf Veins: No evidence of thrombus. Normal compressibility and flow on color Doppler imaging. Superficial Great Saphenous Vein: No evidence of thrombus. Normal compressibility. Venous Reflux:  None. Other Findings:  None. IMPRESSION: No evidence of deep venous thrombosis bilaterally. Electronically Signed   By: Inez Catalina M.D.   On: 05/25/2017 12:54   US Arterial Abi (screening Lower Extremity)  Result Date: 05/26/2017 CLINICAL DATA:  Bilateral lower leg pain x3 weeks. Bilateral claudication. Hypertension, previous tobacco abuse. EXAM: NONINVASIVE PHYSIOLOGIC VASCULAR STUDY OF BILATERAL LOWER EXTREMITIES TECHNIQUE: Evaluation of both lower extremities were performed at rest, including calculation of ankle-brachial indices with single level Doppler, pressure and pulse volume recording. COMPARISON:  None. FINDINGS: Right ABI:  1.13 Left ABI:  1.19 Right Lower Extremity:  Normal arterial waveforms at the ankle. Left Lower Extremity:  Normal arterial waveforms at the ankle. 1.0-1.4 Normal IMPRESSION: No evidence of hemodynamically significant lower extremity arterial occlusive disease at rest. Electronically Signed   By: Lucrezia Europe M.D.   On: 05/26/2017 15:19   Dg Chest Portable 1 View  Result Date: 05/24/2017 CLINICAL DATA:  RESPIRATORY FAILURE, PER ER NOTE, Pt states she was here Friday for same. Complaining of severe lower back pain and left leg pain. PATIENT CURRENTLY ON BYPAP HISTORY OF METHAMPHETAMINE  DEPENDENCE, ETOH ABUSE EXAM: PORTABLE CHEST 1 VIEW COMPARISON:  05/11/2017 FINDINGS: Heart is enlarged. There are no focal consolidations or pleural effusions. No pulmonary edema. IMPRESSION: Stable cardiomegaly. Electronically Signed   By: Nolon Nations M.D.   On: 05/24/2017 18:19   Dg Chest Port 1  View  Result Date: 05/11/2017 CLINICAL DATA:  Acute respiratory failure with hypoxia. EXAM: PORTABLE CHEST 1 VIEW COMPARISON:  Radiograph of May 09, 2017. FINDINGS: Stable cardiomegaly with central pulmonary vascular congestion. No pneumothorax or pleural effusion is noted. No consolidative process is noted. Bony thorax is unremarkable. IMPRESSION: Stable cardiomegaly with probable mild central pulmonary vascular congestion. Electronically Signed   By: Marijo Conception, M.D.   On: 05/11/2017 07:27   Dg Chest Port 1 View  Result Date: 05/09/2017 CLINICAL DATA:  COUGH, Patient reports difficulty breathing for the past 2 years. Patient states her breathing became much worse last night, has felt like "there's no air in the room." Patient reports non productive coughHISTORY OF ANXIETY, ETOH ABUSE EXAM: PORTABLE CHEST 1 VIEW COMPARISON:  01/05/2010 FINDINGS: Cardiac silhouette is mildly enlarged. No mediastinal or hilar masses. No evidence of adenopathy. Clear lungs. No pleural effusion.  No pneumothorax. Skeletal structures are grossly intact. IMPRESSION: No acute cardiopulmonary disease. Electronically Signed   By: Lajean Manes M.D.   On: 05/09/2017 16:14   Dg Knee Complete 4 Views Left  Result Date: 05/23/2017 CLINICAL DATA:  57 year old female with left knee pain. EXAM: LEFT KNEE - COMPLETE 4+ VIEW COMPARISON:  None. FINDINGS: No evidence of fracture, dislocation, or joint effusion. No evidence of arthropathy or other focal bone abnormality. Soft tissues are unremarkable. IMPRESSION: Negative. Electronically Signed   By: Anner Crete M.D.   On: 05/23/2017 00:38   Dg Hip Unilat W Or Wo Pelvis 2-3 Views Left  Result Date: 05/23/2017 CLINICAL DATA:  57 year old female with left lower extremity pain. EXAM: DG HIP (WITH OR WITHOUT PELVIS) 2-3V LEFT COMPARISON:  None. FINDINGS: There is no acute fracture or dislocation. The bones are osteopenic. No significant arthritic changes. The soft tissues appear  unremarkable. IMPRESSION: Negative. Electronically Signed   By: Anner Crete M.D.   On: 05/23/2017 00:37     Subjective: Patient is feeling better today.  Still having occasional back pain and leg pain.  No shortness of breath.  Mental status is at baseline.  Discharge Exam: Vitals:   05/26/17 0730 05/26/17 1321  BP:  140/84  Pulse:  (!) 57  Resp:  20  Temp:  97.9 F (36.6 C)  SpO2: 97% 99%   Vitals:   05/25/17 2003 05/26/17 0408 05/26/17 0730 05/26/17 1321  BP: (!) 128/95 124/71  140/84  Pulse: 70 66  (!) 57  Resp: 18 18  20   Temp: 97.9 F (36.6 C) 97.9 F (36.6 C)  97.9 F (36.6 C)  TempSrc: Oral Oral  Oral  SpO2: 100% 100% 97% 99%  Weight:      Height:        General: Pt is alert, awake, not in acute distress Cardiovascular: RRR, S1/S2 +, no rubs, no gallops Respiratory: CTA bilaterally, no wheezing, no rhonchi Abdominal: Soft, NT, ND, bowel sounds + Extremities: no edema, no cyanosis    The results of significant diagnostics from this hospitalization (including imaging, microbiology, ancillary and laboratory) are listed below for reference.     Microbiology: Recent Results (from the past 240 hour(s))  Blood culture (routine x 2)     Status: None (Preliminary result)  Collection Time: 05/24/17  4:22 PM  Result Value Ref Range Status   Specimen Description   Final    RIGHT ANTECUBITAL BOTTLES DRAWN AEROBIC AND ANAEROBIC   Special Requests Blood Culture adequate volume  Final   Culture   Final    NO GROWTH 2 DAYS Performed at [facility], [address], [place], [place] [address]    Report Status PENDING  Incomplete  Blood culture (routine x 2)     Status: None (Preliminary result)   Collection Time: 05/24/17  4:28 PM  Result Value Ref Range Status   Specimen Description   Final    BLOOD RIGHT ARM BOTTLES DRAWN AEROBIC AND ANAEROBIC   Special Requests Blood Culture adequate volume  Final   Culture   Final    NO GROWTH 2 DAYS Performed at [facility], [address], [place], [place] [address]    Report Status PENDING  Incomplete  MRSA PCR Screening     Status: None   Collection Time: 05/24/17  9:05 PM  Result Value Ref Range Status   MRSA by PCR NEGATIVE NEGATIVE Final    Comment:        The GeneXpert MRSA Assay (FDA approved for NASAL specimens only), is one component of a comprehensive MRSA colonization surveillance program. It is not intended to diagnose MRSA infection nor to guide or monitor treatment for MRSA infections. Performed at [facility], [address], [place], [place] [address]      Labs: BNP (last 3 results) Recent Labs    05/09/17 1524  BNP 063.0*   Basic Metabolic Panel: Recent Labs  Lab 05/23/17 0004 05/24/17 1619 05/25/17 0424 05/26/17 0509  NA 140 136 136 137  K 3.3* 3.9 4.5 3.6  CL 96* 94* 93* 93*  CO2 36* 32 33* 36*  GLUCOSE 98 129* 157* 86  BUN 14 15 14  28*  CREATININE 0.82 0.65 0.78 0.91  CALCIUM 8.8* 9.0 9.0 9.0   Liver Function Tests: Recent Labs  Lab 05/24/17 1619 05/25/17 0424  AST 17 16  ALT 32 31  ALKPHOS 71 75  BILITOT 1.2 1.7*  PROT 6.1* 6.1*  ALBUMIN 3.4* 3.2*   Recent Labs  Lab 05/24/17 1619  LIPASE 27   Recent Labs  Lab 05/24/17 1621  AMMONIA 42*   CBC: Recent Labs  Lab 05/23/17 0004 05/24/17 1619 05/25/17 0424  WBC 17.5* 17.3* 13.0*  NEUTROABS 11.5* 13.5*  --   HGB 16.1* 16.7* 16.4*  HCT 51.9* 52.3* 53.2*  MCV 104.0* 102.5* 102.1*  PLT 210 199 212   Cardiac Enzymes: No results for input(s): CKTOTAL, CKMB, CKMBINDEX, TROPONINI in the last 168 hours. BNP: Invalid input(s): POCBNP CBG: No results for input(s): GLUCAP in the last 168 hours. D-Dimer No results for input(s): DDIMER in the last 72 hours. Hgb A1c No results for input(s): HGBA1C in the last 72 hours. Lipid Profile No results for input(s): CHOL, HDL, LDLCALC, TRIG, CHOLHDL, LDLDIRECT in the last 72 hours. Thyroid function studies No results for input(s): TSH, T4TOTAL,  T3FREE, THYROIDAB in the last 72 hours.  Invalid input(s): FREET3 Anemia work up No results for input(s): VITAMINB12, FOLATE, FERRITIN, TIBC, IRON, RETICCTPCT in the last 72 hours. Urinalysis    Component Value Date/Time   COLORURINE YELLOW 05/24/2017 1710   APPEARANCEUR HAZY (A) 05/24/2017 1710   LABSPEC 1.014 05/24/2017 1710   PHURINE 6.0 05/24/2017 1710   GLUCOSEU NEGATIVE 05/24/2017 1710   HGBUR MODERATE (A) 05/24/2017 1710   BILIRUBINUR NEGATIVE  05/24/2017 1710   KETONESUR NEGATIVE 05/24/2017 1710   PROTEINUR 100 (A) 05/24/2017 1710   UROBILINOGEN 0.2 12/27/2009 1935   NITRITE NEGATIVE 05/24/2017 1710   LEUKOCYTESUR NEGATIVE 05/24/2017 1710   Sepsis Labs Invalid input(s): PROCALCITONIN,  WBC,  LACTICIDVEN Microbiology Recent Results (from the past 240 hour(s))  Blood culture (routine x 2)     Status: None (Preliminary result)   Collection Time: 05/24/17  4:22 PM  Result Value Ref Range Status   Specimen Description   Final    RIGHT ANTECUBITAL BOTTLES DRAWN AEROBIC AND ANAEROBIC   Special Requests Blood Culture adequate volume  Final   Culture   Final    NO GROWTH 2 DAYS Performed at [facility], [address] [place], [place] [address]    Report Status PENDING  Incomplete  Blood culture (routine x 2)     Status: None (Preliminary result)   Collection Time: 05/24/17  4:28 PM  Result Value Ref Range Status   Specimen Description   Final    BLOOD RIGHT ARM BOTTLES DRAWN AEROBIC AND ANAEROBIC   Special Requests Blood Culture adequate volume  Final   Culture   Final    NO GROWTH 2 DAYS Performed at [facility], [address] [place], [place] [address]    Report Status PENDING  Incomplete  MRSA PCR Screening     Status: None   Collection Time: 05/24/17  9:05 PM  Result Value Ref Range Status   MRSA by PCR NEGATIVE NEGATIVE Final    Comment:        The GeneXpert MRSA Assay (FDA approved for NASAL specimens only), is one component of a comprehensive MRSA  colonization surveillance program. It is not intended to diagnose MRSA infection nor to guide or monitor treatment for MRSA infections. Performed at [facility], [address], [place], [place] [address]      Time coordinating discharge: 56mins  SIGNED:   Kathie Dike, MD  [facility] 05/26/2017, 3:44 PM Pager   If 7PM-7AM, please contact night-coverage [email or web address] Password [email or web address]

## 2017-05-26 NOTE — Care Management Note (Signed)
Case Management Note  Patient Details  Name: [name] MRN: [id number] Date of Birth: 12-26-1960  Subjective/Objective:           Admitted with HTN. Pt from home, ind pta. Has PCP, transportation and insurance with drug coverage. Pt received home oxygen and neb machine last admission.          Action/Plan: DC home today with referral for [facility] services. Pt has chosen [facility] from list of providers. Pt aware HH has 48 hrs to make first visit. Pt aware HH will have to get authorization for visits. [name], [facility] rep, aware of referral and will pull pt info from chart.   Expected Discharge Date:      05/26/17            Expected Discharge Plan:  [place]  In-House Referral:  NA  Discharge planning Services  CM Consult  Post Acute Care Choice:  Home Health Choice offered to:  Patient  HH Arranged:  RN [facility] Agency:  [place]  Status of Service:  Completed, signed off  Sherald Barge, RN 05/26/2017, 2:02 PM

## 2017-05-28 DIAGNOSIS — Z7951 Long term (current) use of inhaled steroids: Secondary | ICD-10-CM | POA: Diagnosis not present

## 2017-05-28 DIAGNOSIS — Z6832 Body mass index (BMI) 32.0-32.9, adult: Secondary | ICD-10-CM | POA: Diagnosis not present

## 2017-05-28 DIAGNOSIS — M542 Cervicalgia: Secondary | ICD-10-CM | POA: Diagnosis not present

## 2017-05-28 DIAGNOSIS — F17210 Nicotine dependence, cigarettes, uncomplicated: Secondary | ICD-10-CM | POA: Diagnosis not present

## 2017-05-28 DIAGNOSIS — Z9981 Dependence on supplemental oxygen: Secondary | ICD-10-CM | POA: Diagnosis not present

## 2017-05-28 DIAGNOSIS — G8929 Other chronic pain: Secondary | ICD-10-CM | POA: Diagnosis not present

## 2017-05-28 DIAGNOSIS — F1010 Alcohol abuse, uncomplicated: Secondary | ICD-10-CM | POA: Diagnosis not present

## 2017-05-28 DIAGNOSIS — F419 Anxiety disorder, unspecified: Secondary | ICD-10-CM | POA: Diagnosis not present

## 2017-05-28 DIAGNOSIS — M545 Low back pain: Secondary | ICD-10-CM | POA: Diagnosis not present

## 2017-05-28 DIAGNOSIS — Z7982 Long term (current) use of aspirin: Secondary | ICD-10-CM | POA: Diagnosis not present

## 2017-05-28 DIAGNOSIS — J449 Chronic obstructive pulmonary disease, unspecified: Secondary | ICD-10-CM | POA: Diagnosis not present

## 2017-05-28 DIAGNOSIS — J9612 Chronic respiratory failure with hypercapnia: Secondary | ICD-10-CM | POA: Diagnosis not present

## 2017-05-28 DIAGNOSIS — I110 Hypertensive heart disease with heart failure: Secondary | ICD-10-CM | POA: Diagnosis not present

## 2017-05-28 DIAGNOSIS — I10 Essential (primary) hypertension: Secondary | ICD-10-CM | POA: Diagnosis not present

## 2017-05-28 DIAGNOSIS — I509 Heart failure, unspecified: Secondary | ICD-10-CM | POA: Diagnosis not present

## 2017-05-28 DIAGNOSIS — M79605 Pain in left leg: Secondary | ICD-10-CM | POA: Diagnosis not present

## 2017-05-28 DIAGNOSIS — I5042 Chronic combined systolic (congestive) and diastolic (congestive) heart failure: Secondary | ICD-10-CM | POA: Diagnosis not present

## 2017-05-29 LAB — CULTURE, BLOOD (ROUTINE X 2)
Culture: NO GROWTH
Culture: NO GROWTH
SPECIAL REQUESTS: ADEQUATE
Special Requests: ADEQUATE

## 2017-05-30 DIAGNOSIS — I5042 Chronic combined systolic (congestive) and diastolic (congestive) heart failure: Secondary | ICD-10-CM | POA: Diagnosis not present

## 2017-05-30 DIAGNOSIS — J449 Chronic obstructive pulmonary disease, unspecified: Secondary | ICD-10-CM | POA: Diagnosis not present

## 2017-05-30 DIAGNOSIS — Z9981 Dependence on supplemental oxygen: Secondary | ICD-10-CM | POA: Diagnosis not present

## 2017-05-30 DIAGNOSIS — J9612 Chronic respiratory failure with hypercapnia: Secondary | ICD-10-CM | POA: Diagnosis not present

## 2017-05-30 DIAGNOSIS — M545 Low back pain: Secondary | ICD-10-CM | POA: Diagnosis not present

## 2017-05-30 DIAGNOSIS — F419 Anxiety disorder, unspecified: Secondary | ICD-10-CM | POA: Diagnosis not present

## 2017-05-30 DIAGNOSIS — F17210 Nicotine dependence, cigarettes, uncomplicated: Secondary | ICD-10-CM | POA: Diagnosis not present

## 2017-05-30 DIAGNOSIS — F1010 Alcohol abuse, uncomplicated: Secondary | ICD-10-CM | POA: Diagnosis not present

## 2017-05-30 DIAGNOSIS — M79605 Pain in left leg: Secondary | ICD-10-CM | POA: Diagnosis not present

## 2017-05-30 DIAGNOSIS — Z7982 Long term (current) use of aspirin: Secondary | ICD-10-CM | POA: Diagnosis not present

## 2017-05-30 DIAGNOSIS — Z7951 Long term (current) use of inhaled steroids: Secondary | ICD-10-CM | POA: Diagnosis not present

## 2017-05-30 DIAGNOSIS — G8929 Other chronic pain: Secondary | ICD-10-CM | POA: Diagnosis not present

## 2017-05-30 DIAGNOSIS — I110 Hypertensive heart disease with heart failure: Secondary | ICD-10-CM | POA: Diagnosis not present

## 2017-06-01 DIAGNOSIS — Z7982 Long term (current) use of aspirin: Secondary | ICD-10-CM | POA: Diagnosis not present

## 2017-06-01 DIAGNOSIS — I110 Hypertensive heart disease with heart failure: Secondary | ICD-10-CM | POA: Diagnosis not present

## 2017-06-01 DIAGNOSIS — M79605 Pain in left leg: Secondary | ICD-10-CM | POA: Diagnosis not present

## 2017-06-01 DIAGNOSIS — Z7951 Long term (current) use of inhaled steroids: Secondary | ICD-10-CM | POA: Diagnosis not present

## 2017-06-01 DIAGNOSIS — Z1151 Encounter for screening for human papillomavirus (HPV): Secondary | ICD-10-CM | POA: Diagnosis not present

## 2017-06-01 DIAGNOSIS — F17210 Nicotine dependence, cigarettes, uncomplicated: Secondary | ICD-10-CM | POA: Diagnosis not present

## 2017-06-01 DIAGNOSIS — I5042 Chronic combined systolic (congestive) and diastolic (congestive) heart failure: Secondary | ICD-10-CM | POA: Diagnosis not present

## 2017-06-01 DIAGNOSIS — G8929 Other chronic pain: Secondary | ICD-10-CM | POA: Diagnosis not present

## 2017-06-01 DIAGNOSIS — M545 Low back pain: Secondary | ICD-10-CM | POA: Diagnosis not present

## 2017-06-01 DIAGNOSIS — F419 Anxiety disorder, unspecified: Secondary | ICD-10-CM | POA: Diagnosis not present

## 2017-06-01 DIAGNOSIS — F1010 Alcohol abuse, uncomplicated: Secondary | ICD-10-CM | POA: Diagnosis not present

## 2017-06-01 DIAGNOSIS — J9612 Chronic respiratory failure with hypercapnia: Secondary | ICD-10-CM | POA: Diagnosis not present

## 2017-06-01 DIAGNOSIS — Z9981 Dependence on supplemental oxygen: Secondary | ICD-10-CM | POA: Diagnosis not present

## 2017-06-01 DIAGNOSIS — I509 Heart failure, unspecified: Secondary | ICD-10-CM | POA: Diagnosis not present

## 2017-06-01 DIAGNOSIS — I10 Essential (primary) hypertension: Secondary | ICD-10-CM | POA: Diagnosis not present

## 2017-06-01 DIAGNOSIS — Z0000 Encounter for general adult medical examination without abnormal findings: Secondary | ICD-10-CM | POA: Diagnosis not present

## 2017-06-01 DIAGNOSIS — I5022 Chronic systolic (congestive) heart failure: Secondary | ICD-10-CM | POA: Diagnosis not present

## 2017-06-01 DIAGNOSIS — Z124 Encounter for screening for malignant neoplasm of cervix: Secondary | ICD-10-CM | POA: Diagnosis not present

## 2017-06-01 DIAGNOSIS — J449 Chronic obstructive pulmonary disease, unspecified: Secondary | ICD-10-CM | POA: Diagnosis not present

## 2017-06-02 DIAGNOSIS — I110 Hypertensive heart disease with heart failure: Secondary | ICD-10-CM | POA: Diagnosis not present

## 2017-06-02 DIAGNOSIS — F17210 Nicotine dependence, cigarettes, uncomplicated: Secondary | ICD-10-CM | POA: Diagnosis not present

## 2017-06-02 DIAGNOSIS — J9612 Chronic respiratory failure with hypercapnia: Secondary | ICD-10-CM | POA: Diagnosis not present

## 2017-06-02 DIAGNOSIS — Z9981 Dependence on supplemental oxygen: Secondary | ICD-10-CM | POA: Diagnosis not present

## 2017-06-02 DIAGNOSIS — M79605 Pain in left leg: Secondary | ICD-10-CM | POA: Diagnosis not present

## 2017-06-02 DIAGNOSIS — F1010 Alcohol abuse, uncomplicated: Secondary | ICD-10-CM | POA: Diagnosis not present

## 2017-06-02 DIAGNOSIS — Z7951 Long term (current) use of inhaled steroids: Secondary | ICD-10-CM | POA: Diagnosis not present

## 2017-06-02 DIAGNOSIS — G8929 Other chronic pain: Secondary | ICD-10-CM | POA: Diagnosis not present

## 2017-06-02 DIAGNOSIS — M545 Low back pain: Secondary | ICD-10-CM | POA: Diagnosis not present

## 2017-06-02 DIAGNOSIS — I5042 Chronic combined systolic (congestive) and diastolic (congestive) heart failure: Secondary | ICD-10-CM | POA: Diagnosis not present

## 2017-06-02 DIAGNOSIS — F419 Anxiety disorder, unspecified: Secondary | ICD-10-CM | POA: Diagnosis not present

## 2017-06-02 DIAGNOSIS — Z7982 Long term (current) use of aspirin: Secondary | ICD-10-CM | POA: Diagnosis not present

## 2017-06-02 DIAGNOSIS — J449 Chronic obstructive pulmonary disease, unspecified: Secondary | ICD-10-CM | POA: Diagnosis not present

## 2017-06-03 NOTE — Progress Notes (Signed)
Cardiology Office Note   Date:  06/04/2017   ID:  PENNE ROSENSTOCK, Alferd Apa 09/01/1960, MRN [id number]  PCP:  Celene Squibb, MD  Cardiologist:   Jenkins Rouge, MD   No chief complaint on file.     History of Present Illness: [name] is a 57 y.o. female who presents for consultation regarding diastolic CHF. Referred by Dr Nevada Crane However having read her recent notes from hospitalization 5/12-5/14/19 she appears to primarily have pulmonary issues. She has [employer] home oxygen and brought to hospital with altered MS and hypercapnic respiratory failure in setting of polypharmacy for pain and anxiety including percocet, lorazepam.  CO2 was 66 Improved with Bipap Also complained leg and back pain. No vascular disease by ABI's MRI ok and ? Neuropathic started on gabapentin  She had no chest pain , arrhythmia palpitations cough or fever  TTE reviewed from 05/10/17 EF 45-62% Grade 2 diastolic dysfunction  No significant valve disease  PA pressure not estimated   Her sister from [place] had come to visit She has stage 4 colon cancer. She had percocet, xanax and two "stiff" margaret's Which lead to her hospitalization   Past Medical History:  Diagnosis Date  . Anxiety   . ETOH abuse   . Hemorrhoids   . Methamphetamine dependence ([place])    history of BSD and    Past Surgical History:  Procedure Laterality Date  . CHOLECYSTECTOMY  90's  . ERCP     in the past with Dr.Rourk for CBD stone?  . TUBAL LIGATION       Current Outpatient Medications  Medication Sig Dispense Refill  . albuterol (PROVENTIL HFA;VENTOLIN HFA) 108 (90 Base) MCG/ACT inhaler Inhale 2 puffs into the lungs every 4 (four) hours as needed for wheezing or shortness of breath (cough, shortness of breath or wheezing.). 1 Inhaler 1  . aspirin EC 325 MG tablet Take 325 mg by mouth daily as needed for mild pain.    . carvedilol (COREG) 6.25 MG tablet Take 1 tablet (6.25 mg total) by mouth 2 (two) times daily with a meal. 60  tablet 0  . furosemide (LASIX) 40 MG tablet Take 1 tablet (40 mg total) by mouth daily. 30 tablet 0  . gabapentin (NEURONTIN) 100 MG capsule Take 1 capsule (100 mg total) by mouth 3 (three) times daily. 90 capsule 0  . ipratropium-albuterol (DUONEB) 0.5-2.5 (3) MG/3ML SOLN Take 3 mLs by nebulization every 4 (four) hours as needed (wheezing, coughing, SOB). 360 mL 0  . methocarbamol (ROBAXIN) 750 MG tablet TAKE ONE TABLET BY MOUTH EVERY 8 HOURS AS NEEDED FOR MUSCLE SPASMS  0  . oxyCODONE-acetaminophen (PERCOCET) 5-325 MG tablet Take 1 tablet by mouth every 8 (eight) hours as needed for severe pain. 6 tablet 0  . potassium chloride SA (K-DUR,KLOR-CON) 20 MEQ tablet Take 2 tablets (40 mEq total) by mouth daily. 60 tablet 0   No current facility-administered medications for this visit.     Allergies:   Lisinopril and Propoxyphene n-acetaminophen    Social History:  The patient  reports that she has been smoking cigarettes.  She has been smoking about 0.50 packs per day. She has never used smokeless tobacco. She reports that she drank alcohol. She reports that she has current or past drug history. Drug: Methamphetamines.   Family History:  The patient's family history is not on file.    ROS:  Please see the history of present illness.   Otherwise, review of systems are  positive for none.   All other systems are reviewed and negative.    PHYSICAL EXAM: VS:  BP 136/88   Pulse 80   Ht 5\' 6"  (1.676 m)   Wt 204 lb 6.4 oz (92.7 kg)   SpO2 93%   BMI 32.99 kg/m  , BMI Body mass index is 32.99 kg/m. Affect appropriate Chronically ill female  HEENT: normal Neck supple with no adenopathy JVP normal no bruits no thyromegaly Lungs rhonchi ext wheezing and good diaphragmatic motion Heart:  S1/S2 no murmur, no rub, gallop or click PMI normal Abdomen: benighn, BS positve, no tenderness, no AAA no bruit.  No HSM or HJR Distal pulses intact with no bruits No edema Neuro non-focal Skin warm and  dry No muscular weakness    EKG:  05/11/17 SR PAC no acute ST changes no old infarcts    Recent Labs: 05/09/2017: B Natriuretic Peptide 615.0 05/11/2017: Magnesium 1.5 05/25/2017: ALT 31; Hemoglobin 16.4; Platelets 212 05/26/2017: BUN 28; Creatinine, Ser 0.91; Potassium 3.6; Sodium 137    Lipid Panel    Component Value Date/Time   CHOL  01/07/2010 0519    109        ATP III CLASSIFICATION:  <200     mg/dL   Desirable  200-239  mg/dL   Borderline High  >=240    mg/dL   High          TRIG 103 01/07/2010 0519      Wt Readings from Last 3 Encounters:  06/04/17 204 lb 6.4 oz (92.7 kg)  05/25/17 217 lb 2.5 oz (98.5 kg)  05/22/17 212 lb (96.2 kg)      Other studies Reviewed: Additional studies/ records that were reviewed today include: Hospital notes May 2019 labs ECG and TTE .    ASSESSMENT AND PLAN:  1.  Diastolic CHF:  Continue lasix related to other co morbidities euvolemic  2. COPD: with hypercapnic respiratory failure Continue oxygen avoid mixing narcotic pain meds with anxiolytics 3. Neuropathy:  Continue gabapentin no evidence of claudication 4. HTN:  Well controlled.  Continue current medications and low sodium Dash type diet.      Current medicines are reviewed at length with the patient today.  The patient does not have concerns regarding medicines.  The following changes have been made:  no change  Labs/ tests ordered today include: None No orders of the defined types were placed in this encounter.    Disposition:   FU with cardiology PRN      Signed, Jenkins Rouge, MD  06/04/2017 11:07 AM    [address], [place], [place]  [address] Phone: [phone]; Fax: [phone]

## 2017-06-04 ENCOUNTER — Encounter: Payer: Self-pay | Admitting: Cardiovascular Disease

## 2017-06-04 ENCOUNTER — Ambulatory Visit (INDEPENDENT_AMBULATORY_CARE_PROVIDER_SITE_OTHER): Payer: BLUE CROSS/BLUE SHIELD | Admitting: Cardiovascular Disease

## 2017-06-04 VITALS — BP 136/88 | HR 80 | Ht 66.0 in | Wt 204.4 lb

## 2017-06-04 DIAGNOSIS — I5032 Chronic diastolic (congestive) heart failure: Secondary | ICD-10-CM

## 2017-06-04 NOTE — Patient Instructions (Signed)
Medication Instructions:  Your physician recommends that you continue on your current medications as directed. Please refer to the Current Medication list given to you today.   Follow-Up: Your physician recommends that you schedule a follow-up appointment in: AS NEEDED    Any Other Special Instructions Will Be Listed Below (If Applicable).     If you need a refill on your cardiac medications before your next appointment, please call your pharmacy.

## 2017-06-05 DIAGNOSIS — G8929 Other chronic pain: Secondary | ICD-10-CM | POA: Diagnosis not present

## 2017-06-05 DIAGNOSIS — I110 Hypertensive heart disease with heart failure: Secondary | ICD-10-CM | POA: Diagnosis not present

## 2017-06-05 DIAGNOSIS — I5042 Chronic combined systolic (congestive) and diastolic (congestive) heart failure: Secondary | ICD-10-CM | POA: Diagnosis not present

## 2017-06-05 DIAGNOSIS — M79605 Pain in left leg: Secondary | ICD-10-CM | POA: Diagnosis not present

## 2017-06-05 DIAGNOSIS — Z9981 Dependence on supplemental oxygen: Secondary | ICD-10-CM | POA: Diagnosis not present

## 2017-06-05 DIAGNOSIS — Z7951 Long term (current) use of inhaled steroids: Secondary | ICD-10-CM | POA: Diagnosis not present

## 2017-06-05 DIAGNOSIS — F17210 Nicotine dependence, cigarettes, uncomplicated: Secondary | ICD-10-CM | POA: Diagnosis not present

## 2017-06-05 DIAGNOSIS — M545 Low back pain: Secondary | ICD-10-CM | POA: Diagnosis not present

## 2017-06-05 DIAGNOSIS — J9612 Chronic respiratory failure with hypercapnia: Secondary | ICD-10-CM | POA: Diagnosis not present

## 2017-06-05 DIAGNOSIS — Z7982 Long term (current) use of aspirin: Secondary | ICD-10-CM | POA: Diagnosis not present

## 2017-06-05 DIAGNOSIS — J449 Chronic obstructive pulmonary disease, unspecified: Secondary | ICD-10-CM | POA: Diagnosis not present

## 2017-06-05 DIAGNOSIS — F1010 Alcohol abuse, uncomplicated: Secondary | ICD-10-CM | POA: Diagnosis not present

## 2017-06-05 DIAGNOSIS — F419 Anxiety disorder, unspecified: Secondary | ICD-10-CM | POA: Diagnosis not present

## 2017-06-08 DIAGNOSIS — F17210 Nicotine dependence, cigarettes, uncomplicated: Secondary | ICD-10-CM | POA: Diagnosis not present

## 2017-06-08 DIAGNOSIS — Z7982 Long term (current) use of aspirin: Secondary | ICD-10-CM | POA: Diagnosis not present

## 2017-06-08 DIAGNOSIS — Z9981 Dependence on supplemental oxygen: Secondary | ICD-10-CM | POA: Diagnosis not present

## 2017-06-08 DIAGNOSIS — I110 Hypertensive heart disease with heart failure: Secondary | ICD-10-CM | POA: Diagnosis not present

## 2017-06-08 DIAGNOSIS — M79605 Pain in left leg: Secondary | ICD-10-CM | POA: Diagnosis not present

## 2017-06-08 DIAGNOSIS — M545 Low back pain: Secondary | ICD-10-CM | POA: Diagnosis not present

## 2017-06-08 DIAGNOSIS — J449 Chronic obstructive pulmonary disease, unspecified: Secondary | ICD-10-CM | POA: Diagnosis not present

## 2017-06-08 DIAGNOSIS — F419 Anxiety disorder, unspecified: Secondary | ICD-10-CM | POA: Diagnosis not present

## 2017-06-08 DIAGNOSIS — I5042 Chronic combined systolic (congestive) and diastolic (congestive) heart failure: Secondary | ICD-10-CM | POA: Diagnosis not present

## 2017-06-08 DIAGNOSIS — J9612 Chronic respiratory failure with hypercapnia: Secondary | ICD-10-CM | POA: Diagnosis not present

## 2017-06-08 DIAGNOSIS — Z7951 Long term (current) use of inhaled steroids: Secondary | ICD-10-CM | POA: Diagnosis not present

## 2017-06-08 DIAGNOSIS — F1010 Alcohol abuse, uncomplicated: Secondary | ICD-10-CM | POA: Diagnosis not present

## 2017-06-08 DIAGNOSIS — G8929 Other chronic pain: Secondary | ICD-10-CM | POA: Diagnosis not present

## 2017-06-10 DIAGNOSIS — Z9981 Dependence on supplemental oxygen: Secondary | ICD-10-CM | POA: Diagnosis not present

## 2017-06-10 DIAGNOSIS — I5042 Chronic combined systolic (congestive) and diastolic (congestive) heart failure: Secondary | ICD-10-CM | POA: Diagnosis not present

## 2017-06-10 DIAGNOSIS — I110 Hypertensive heart disease with heart failure: Secondary | ICD-10-CM | POA: Diagnosis not present

## 2017-06-10 DIAGNOSIS — F419 Anxiety disorder, unspecified: Secondary | ICD-10-CM | POA: Diagnosis not present

## 2017-06-10 DIAGNOSIS — Z7982 Long term (current) use of aspirin: Secondary | ICD-10-CM | POA: Diagnosis not present

## 2017-06-10 DIAGNOSIS — J449 Chronic obstructive pulmonary disease, unspecified: Secondary | ICD-10-CM | POA: Diagnosis not present

## 2017-06-10 DIAGNOSIS — F17210 Nicotine dependence, cigarettes, uncomplicated: Secondary | ICD-10-CM | POA: Diagnosis not present

## 2017-06-10 DIAGNOSIS — G8929 Other chronic pain: Secondary | ICD-10-CM | POA: Diagnosis not present

## 2017-06-10 DIAGNOSIS — M79605 Pain in left leg: Secondary | ICD-10-CM | POA: Diagnosis not present

## 2017-06-10 DIAGNOSIS — J9612 Chronic respiratory failure with hypercapnia: Secondary | ICD-10-CM | POA: Diagnosis not present

## 2017-06-10 DIAGNOSIS — Z7951 Long term (current) use of inhaled steroids: Secondary | ICD-10-CM | POA: Diagnosis not present

## 2017-06-10 DIAGNOSIS — F1010 Alcohol abuse, uncomplicated: Secondary | ICD-10-CM | POA: Diagnosis not present

## 2017-06-10 DIAGNOSIS — M545 Low back pain: Secondary | ICD-10-CM | POA: Diagnosis not present

## 2017-06-11 DIAGNOSIS — Z7951 Long term (current) use of inhaled steroids: Secondary | ICD-10-CM | POA: Diagnosis not present

## 2017-06-11 DIAGNOSIS — M79605 Pain in left leg: Secondary | ICD-10-CM | POA: Diagnosis not present

## 2017-06-11 DIAGNOSIS — J9612 Chronic respiratory failure with hypercapnia: Secondary | ICD-10-CM | POA: Diagnosis not present

## 2017-06-11 DIAGNOSIS — F1010 Alcohol abuse, uncomplicated: Secondary | ICD-10-CM | POA: Diagnosis not present

## 2017-06-11 DIAGNOSIS — F17210 Nicotine dependence, cigarettes, uncomplicated: Secondary | ICD-10-CM | POA: Diagnosis not present

## 2017-06-11 DIAGNOSIS — I5042 Chronic combined systolic (congestive) and diastolic (congestive) heart failure: Secondary | ICD-10-CM | POA: Diagnosis not present

## 2017-06-11 DIAGNOSIS — I110 Hypertensive heart disease with heart failure: Secondary | ICD-10-CM | POA: Diagnosis not present

## 2017-06-11 DIAGNOSIS — F419 Anxiety disorder, unspecified: Secondary | ICD-10-CM | POA: Diagnosis not present

## 2017-06-11 DIAGNOSIS — Z9981 Dependence on supplemental oxygen: Secondary | ICD-10-CM | POA: Diagnosis not present

## 2017-06-11 DIAGNOSIS — J449 Chronic obstructive pulmonary disease, unspecified: Secondary | ICD-10-CM | POA: Diagnosis not present

## 2017-06-11 DIAGNOSIS — G8929 Other chronic pain: Secondary | ICD-10-CM | POA: Diagnosis not present

## 2017-06-11 DIAGNOSIS — J441 Chronic obstructive pulmonary disease with (acute) exacerbation: Secondary | ICD-10-CM | POA: Diagnosis not present

## 2017-06-11 DIAGNOSIS — M545 Low back pain: Secondary | ICD-10-CM | POA: Diagnosis not present

## 2017-06-11 DIAGNOSIS — Z7982 Long term (current) use of aspirin: Secondary | ICD-10-CM | POA: Diagnosis not present

## 2017-06-15 DIAGNOSIS — Z7951 Long term (current) use of inhaled steroids: Secondary | ICD-10-CM | POA: Diagnosis not present

## 2017-06-15 DIAGNOSIS — F1010 Alcohol abuse, uncomplicated: Secondary | ICD-10-CM | POA: Diagnosis not present

## 2017-06-15 DIAGNOSIS — I5042 Chronic combined systolic (congestive) and diastolic (congestive) heart failure: Secondary | ICD-10-CM | POA: Diagnosis not present

## 2017-06-15 DIAGNOSIS — M545 Low back pain: Secondary | ICD-10-CM | POA: Diagnosis not present

## 2017-06-15 DIAGNOSIS — Z9981 Dependence on supplemental oxygen: Secondary | ICD-10-CM | POA: Diagnosis not present

## 2017-06-15 DIAGNOSIS — J9612 Chronic respiratory failure with hypercapnia: Secondary | ICD-10-CM | POA: Diagnosis not present

## 2017-06-15 DIAGNOSIS — G8929 Other chronic pain: Secondary | ICD-10-CM | POA: Diagnosis not present

## 2017-06-15 DIAGNOSIS — J449 Chronic obstructive pulmonary disease, unspecified: Secondary | ICD-10-CM | POA: Diagnosis not present

## 2017-06-15 DIAGNOSIS — I110 Hypertensive heart disease with heart failure: Secondary | ICD-10-CM | POA: Diagnosis not present

## 2017-06-15 DIAGNOSIS — F419 Anxiety disorder, unspecified: Secondary | ICD-10-CM | POA: Diagnosis not present

## 2017-06-15 DIAGNOSIS — F17210 Nicotine dependence, cigarettes, uncomplicated: Secondary | ICD-10-CM | POA: Diagnosis not present

## 2017-06-15 DIAGNOSIS — Z7982 Long term (current) use of aspirin: Secondary | ICD-10-CM | POA: Diagnosis not present

## 2017-06-15 DIAGNOSIS — M79605 Pain in left leg: Secondary | ICD-10-CM | POA: Diagnosis not present

## 2017-06-17 DIAGNOSIS — I5022 Chronic systolic (congestive) heart failure: Secondary | ICD-10-CM | POA: Diagnosis not present

## 2017-06-17 DIAGNOSIS — I10 Essential (primary) hypertension: Secondary | ICD-10-CM | POA: Diagnosis not present

## 2017-06-17 DIAGNOSIS — J449 Chronic obstructive pulmonary disease, unspecified: Secondary | ICD-10-CM | POA: Diagnosis not present

## 2017-06-17 DIAGNOSIS — F17200 Nicotine dependence, unspecified, uncomplicated: Secondary | ICD-10-CM | POA: Diagnosis not present

## 2017-06-18 DIAGNOSIS — Z7982 Long term (current) use of aspirin: Secondary | ICD-10-CM | POA: Diagnosis not present

## 2017-06-18 DIAGNOSIS — I110 Hypertensive heart disease with heart failure: Secondary | ICD-10-CM | POA: Diagnosis not present

## 2017-06-18 DIAGNOSIS — I5042 Chronic combined systolic (congestive) and diastolic (congestive) heart failure: Secondary | ICD-10-CM | POA: Diagnosis not present

## 2017-06-18 DIAGNOSIS — J449 Chronic obstructive pulmonary disease, unspecified: Secondary | ICD-10-CM | POA: Diagnosis not present

## 2017-06-18 DIAGNOSIS — F17210 Nicotine dependence, cigarettes, uncomplicated: Secondary | ICD-10-CM | POA: Diagnosis not present

## 2017-06-18 DIAGNOSIS — J9612 Chronic respiratory failure with hypercapnia: Secondary | ICD-10-CM | POA: Diagnosis not present

## 2017-06-18 DIAGNOSIS — F419 Anxiety disorder, unspecified: Secondary | ICD-10-CM | POA: Diagnosis not present

## 2017-06-18 DIAGNOSIS — Z7951 Long term (current) use of inhaled steroids: Secondary | ICD-10-CM | POA: Diagnosis not present

## 2017-06-18 DIAGNOSIS — M545 Low back pain: Secondary | ICD-10-CM | POA: Diagnosis not present

## 2017-06-18 DIAGNOSIS — Z9981 Dependence on supplemental oxygen: Secondary | ICD-10-CM | POA: Diagnosis not present

## 2017-06-18 DIAGNOSIS — M79605 Pain in left leg: Secondary | ICD-10-CM | POA: Diagnosis not present

## 2017-06-18 DIAGNOSIS — F1010 Alcohol abuse, uncomplicated: Secondary | ICD-10-CM | POA: Diagnosis not present

## 2017-06-18 DIAGNOSIS — G8929 Other chronic pain: Secondary | ICD-10-CM | POA: Diagnosis not present

## 2017-07-12 DIAGNOSIS — J441 Chronic obstructive pulmonary disease with (acute) exacerbation: Secondary | ICD-10-CM | POA: Diagnosis not present

## 2017-07-28 DIAGNOSIS — R209 Unspecified disturbances of skin sensation: Secondary | ICD-10-CM | POA: Diagnosis not present

## 2017-07-28 DIAGNOSIS — M545 Low back pain: Secondary | ICD-10-CM | POA: Diagnosis not present

## 2017-07-28 DIAGNOSIS — G603 Idiopathic progressive neuropathy: Secondary | ICD-10-CM | POA: Diagnosis not present

## 2017-07-28 DIAGNOSIS — M542 Cervicalgia: Secondary | ICD-10-CM | POA: Diagnosis not present

## 2017-07-28 DIAGNOSIS — Z79891 Long term (current) use of opiate analgesic: Secondary | ICD-10-CM | POA: Diagnosis not present

## 2017-08-04 DIAGNOSIS — E039 Hypothyroidism, unspecified: Secondary | ICD-10-CM | POA: Diagnosis not present

## 2017-08-04 DIAGNOSIS — G602 Neuropathy in association with hereditary ataxia: Secondary | ICD-10-CM | POA: Diagnosis not present

## 2017-08-04 DIAGNOSIS — R5383 Other fatigue: Secondary | ICD-10-CM | POA: Diagnosis not present

## 2017-08-04 DIAGNOSIS — G603 Idiopathic progressive neuropathy: Secondary | ICD-10-CM | POA: Diagnosis not present

## 2017-08-06 DIAGNOSIS — G603 Idiopathic progressive neuropathy: Secondary | ICD-10-CM | POA: Diagnosis not present

## 2017-08-06 DIAGNOSIS — M5412 Radiculopathy, cervical region: Secondary | ICD-10-CM | POA: Diagnosis not present

## 2017-08-11 DIAGNOSIS — J441 Chronic obstructive pulmonary disease with (acute) exacerbation: Secondary | ICD-10-CM | POA: Diagnosis not present

## 2017-08-20 DIAGNOSIS — G603 Idiopathic progressive neuropathy: Secondary | ICD-10-CM | POA: Diagnosis not present

## 2017-09-08 DIAGNOSIS — G603 Idiopathic progressive neuropathy: Secondary | ICD-10-CM | POA: Diagnosis not present

## 2017-09-08 DIAGNOSIS — M542 Cervicalgia: Secondary | ICD-10-CM | POA: Diagnosis not present

## 2017-09-08 DIAGNOSIS — M79603 Pain in arm, unspecified: Secondary | ICD-10-CM | POA: Diagnosis not present

## 2017-09-11 DIAGNOSIS — J441 Chronic obstructive pulmonary disease with (acute) exacerbation: Secondary | ICD-10-CM | POA: Diagnosis not present

## 2017-09-21 DIAGNOSIS — I10 Essential (primary) hypertension: Secondary | ICD-10-CM | POA: Diagnosis not present

## 2017-09-21 DIAGNOSIS — I5022 Chronic systolic (congestive) heart failure: Secondary | ICD-10-CM | POA: Diagnosis not present

## 2017-09-21 DIAGNOSIS — I509 Heart failure, unspecified: Secondary | ICD-10-CM | POA: Diagnosis not present

## 2017-09-21 DIAGNOSIS — M542 Cervicalgia: Secondary | ICD-10-CM | POA: Diagnosis not present

## 2017-09-21 DIAGNOSIS — Z6832 Body mass index (BMI) 32.0-32.9, adult: Secondary | ICD-10-CM | POA: Diagnosis not present

## 2017-09-23 ENCOUNTER — Other Ambulatory Visit (HOSPITAL_COMMUNITY): Payer: Self-pay | Admitting: Respiratory Therapy

## 2017-09-23 DIAGNOSIS — I10 Essential (primary) hypertension: Secondary | ICD-10-CM | POA: Diagnosis not present

## 2017-09-23 DIAGNOSIS — F17200 Nicotine dependence, unspecified, uncomplicated: Secondary | ICD-10-CM | POA: Diagnosis not present

## 2017-09-23 DIAGNOSIS — R0602 Shortness of breath: Secondary | ICD-10-CM

## 2017-09-23 DIAGNOSIS — J449 Chronic obstructive pulmonary disease, unspecified: Secondary | ICD-10-CM | POA: Diagnosis not present

## 2017-09-23 DIAGNOSIS — Z23 Encounter for immunization: Secondary | ICD-10-CM | POA: Diagnosis not present

## 2017-09-23 DIAGNOSIS — I5022 Chronic systolic (congestive) heart failure: Secondary | ICD-10-CM | POA: Diagnosis not present

## 2017-09-25 ENCOUNTER — Other Ambulatory Visit (HOSPITAL_COMMUNITY): Payer: Self-pay | Admitting: Respiratory Therapy

## 2017-09-27 ENCOUNTER — Encounter (HOSPITAL_COMMUNITY): Payer: Self-pay | Admitting: Emergency Medicine

## 2017-09-27 ENCOUNTER — Emergency Department (HOSPITAL_COMMUNITY)
Admission: EM | Admit: 2017-09-27 | Discharge: 2017-09-27 | Disposition: A | Discharge status: Home / Self Care | Payer: BLUE CROSS/BLUE SHIELD | Attending: Emergency Medicine | Admitting: Emergency Medicine

## 2017-09-27 DIAGNOSIS — F17210 Nicotine dependence, cigarettes, uncomplicated: Secondary | ICD-10-CM | POA: Insufficient documentation

## 2017-09-27 DIAGNOSIS — F1910 Other psychoactive substance abuse, uncomplicated: Secondary | ICD-10-CM | POA: Diagnosis not present

## 2017-09-27 DIAGNOSIS — I5032 Chronic diastolic (congestive) heart failure: Secondary | ICD-10-CM | POA: Insufficient documentation

## 2017-09-27 DIAGNOSIS — F1990 Other psychoactive substance use, unspecified, uncomplicated: Secondary | ICD-10-CM

## 2017-09-27 DIAGNOSIS — I110 Hypertensive heart disease with heart failure: Secondary | ICD-10-CM | POA: Insufficient documentation

## 2017-09-27 DIAGNOSIS — Z760 Encounter for issue of repeat prescription: Secondary | ICD-10-CM | POA: Diagnosis not present

## 2017-09-27 DIAGNOSIS — F1999 Other psychoactive substance use, unspecified with unspecified psychoactive substance-induced disorder: Secondary | ICD-10-CM | POA: Diagnosis not present

## 2017-09-27 DIAGNOSIS — Z79899 Other long term (current) drug therapy: Secondary | ICD-10-CM | POA: Insufficient documentation

## 2017-09-27 DIAGNOSIS — F419 Anxiety disorder, unspecified: Secondary | ICD-10-CM | POA: Diagnosis not present

## 2017-09-27 DIAGNOSIS — J449 Chronic obstructive pulmonary disease, unspecified: Secondary | ICD-10-CM | POA: Diagnosis not present

## 2017-09-27 DIAGNOSIS — Z9114 Patient's other noncompliance with medication regimen: Secondary | ICD-10-CM | POA: Diagnosis not present

## 2017-09-27 NOTE — ED Triage Notes (Signed)
Pt very upset and somewhat demanding at triage.  States she didn't know she needed permission to double up on her medications and she is hurting and needs help.

## 2017-09-27 NOTE — ED Provider Notes (Signed)
[facility] EMERGENCY DEPARTMENT Provider Note   CSN: [id number] Arrival date & time: 09/27/17  1703     History   Chief Complaint Chief Complaint  Patient presents with  . Medication Refill    HPI [name] is a 56 y.o. female.  HPI   56yF presenting for med refill. Prescribed tramadol. For over past week she has been taking 2 every 4 hours. She has been out for two days now. She reports she has pain from neuropathy. Feeling anxious and in a lot of pain.   Past Medical History:  Diagnosis Date  . Anxiety   . ETOH abuse   . Hemorrhoids   . 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 COPD (chronic obstructive pulmonary disease) ([place]) 05/25/2017  . Acute on chronic respiratory failure with hypercapnia ([place]) 05/25/2017  . Back pain 05/25/2017  . Acute metabolic encephalopathy 83/15/1761  . Chronic diastolic CHF (congestive heart failure) ([place]) 05/25/2017  . Acute respiratory failure ([place]) 05/24/2017  . COPD with acute exacerbation ([place]) 05/09/2017  . Essential hypertension 05/09/2017  . Acute systolic CHF (congestive heart failure) ([place]) 05/09/2017  . Acute respiratory failure with hypoxia ([place]) 05/09/2017  . ALCOHOL ABUSE, HX OF 01/30/2010  . PANCREATITIS, HX OF 01/30/2010    Past Surgical History:  Procedure Laterality Date  . CHOLECYSTECTOMY  90's  . ERCP     in the past with Dr.Rourk for CBD stone?  . TUBAL LIGATION       OB History    Gravida  4   Para  3   Term  3   Preterm      AB  1   Living        SAB  1   TAB      Ectopic      Multiple      Live Births               Home Medications    Prior to Admission medications   Medication Sig Start Date End Date Taking? Authorizing Provider  albuterol (PROVENTIL HFA;VENTOLIN HFA) 108 (90 Base) MCG/ACT inhaler Inhale 2 puffs into the lungs every 4 (four) hours as needed for wheezing or shortness of breath  (cough, shortness of breath or wheezing.). 05/12/17   Murlean Iba, MD  aspirin EC 325 MG tablet Take 325 mg by mouth daily as needed for mild pain.    [provider]  carvedilol (COREG) 6.25 MG tablet Take 1 tablet (6.25 mg total) by mouth 2 (two) times daily with a meal. 05/12/17 06/11/17  Johnson, Clanford L, MD  furosemide (LASIX) 40 MG tablet Take 1 tablet (40 mg total) by mouth daily. 05/12/17 06/11/17  Johnson, Clanford L, MD  gabapentin (NEURONTIN) 100 MG capsule Take 1 capsule (100 mg total) by mouth 3 (three) times daily. 05/26/17   Kathie Dike, MD  ipratropium-albuterol (DUONEB) 0.5-2.5 (3) MG/3ML SOLN Take 3 mLs by nebulization every 4 (four) hours as needed (wheezing, coughing, SOB). 05/12/17   Johnson, Clanford L, MD  methocarbamol (ROBAXIN) 750 MG tablet TAKE ONE TABLET BY MOUTH EVERY 8 HOURS AS NEEDED FOR MUSCLE SPASMS 06/01/17   [provider]  oxyCODONE-acetaminophen (PERCOCET) 5-325 MG tablet Take 1 tablet by mouth every 8 (eight) hours as needed for severe pain. 05/23/17   Ripley Fraise, MD  potassium chloride SA (K-DUR,KLOR-CON) 20 MEQ tablet Take 2 tablets (40  mEq total) by mouth daily. 05/12/17 06/11/17  Murlean Iba, MD    Family History History reviewed. No pertinent family history.  Social History Social History   Tobacco Use  . Smoking status: Current Every Day Smoker    Packs/day: 0.50    Types: Cigarettes  . Smokeless tobacco: Never Used  . Tobacco comment: 2 a day per pt report  Substance Use Topics  . Alcohol use: Not Currently    Frequency: Never    Comment: occas  . Drug use: Yes    Types: Methamphetamines    Comment: on drug list HX     Allergies   Lisinopril and Propoxyphene n-acetaminophen   Review of Systems Review of Systems  All systems reviewed and negative, other than as noted in HPI.  Physical Exam Updated Vital Signs BP (!) 151/88 (BP Location: Right Arm)   Pulse 88   Temp 97.8 F (36.6 C) (Oral)    Resp 18   Ht 5\' 6"  (1.676 m)   Wt 74.8 kg   SpO2 100%   BMI 26.63 kg/m   Physical Exam  Constitutional: She appears well-developed and well-nourished. No distress.  HENT:  Head: Normocephalic and atraumatic.  Eyes: Conjunctivae are normal. Right eye exhibits no discharge. Left eye exhibits no discharge.  Neck: Neck supple.  Cardiovascular: Normal rate, regular rhythm and normal heart sounds. Exam reveals no gallop and no friction rub.  No murmur heard. Pulmonary/Chest: Effort normal and breath sounds normal. No respiratory distress.  Abdominal: Soft. She exhibits no distension. There is no tenderness.  Musculoskeletal: She exhibits no edema or tenderness.  Neurological: She is alert.  Skin: Skin is warm and dry.  Psychiatric: Her behavior is normal. Thought content normal.  Anxious. Crying at times.   Nursing note and vitals reviewed.    ED Treatments / Results  Labs (all labs ordered are listed, but only abnormal results are displayed) Labs Reviewed - No data to display  EKG None  Radiology No results found.  Procedures Procedures (including critical care time)  Medications Ordered in ED Medications - No data to display   Initial Impression / Assessment and Plan / ED Course  I have reviewed the triage vital signs and the nursing notes.  Pertinent labs & imaging results that were available during my care of the patient were reviewed by me and considered in my medical decision making (see chart for details).     57yF presenting for refill of her tramadol. She had been taking double the amount prescribed w/o consulting with prescribing physician. This is not appropriate nor would it be appropriate for me to prescribe her additional medication in this scenario. She is likely having some withdrawal symptoms and left the ER upset but in no acute distress.   Final Clinical Impressions(s) / ED Diagnoses   Final diagnoses:  Misuse of prescription only drugs [facility])     ED Discharge Orders    None       Virgel Manifold, MD 09/27/17 [phone]

## 2017-09-27 NOTE — ED Triage Notes (Signed)
Pt reports she has been doubling up on her Tramadol 50mg  and has ran out 13 days early.  She was prescribed 120 tablets on 8/27 and ran out 2 days ago.  States she needs more pain medication.

## 2017-10-01 DIAGNOSIS — M542 Cervicalgia: Secondary | ICD-10-CM | POA: Diagnosis not present

## 2017-10-01 DIAGNOSIS — M79603 Pain in arm, unspecified: Secondary | ICD-10-CM | POA: Diagnosis not present

## 2017-10-01 DIAGNOSIS — R209 Unspecified disturbances of skin sensation: Secondary | ICD-10-CM | POA: Diagnosis not present

## 2017-10-01 DIAGNOSIS — G603 Idiopathic progressive neuropathy: Secondary | ICD-10-CM | POA: Diagnosis not present

## 2017-10-02 ENCOUNTER — Other Ambulatory Visit (HOSPITAL_COMMUNITY): Payer: Self-pay | Admitting: Internal Medicine

## 2017-10-02 DIAGNOSIS — Z1231 Encounter for screening mammogram for malignant neoplasm of breast: Secondary | ICD-10-CM

## 2017-10-06 ENCOUNTER — Encounter: Payer: Self-pay | Admitting: Internal Medicine

## 2017-10-07 ENCOUNTER — Ambulatory Visit (HOSPITAL_COMMUNITY): Admission: RE | Admit: 2017-10-07 | Payer: Managed Care, Other (non HMO) | Source: Ambulatory Visit

## 2017-10-09 ENCOUNTER — Ambulatory Visit (HOSPITAL_COMMUNITY)
Admission: RE | Admit: 2017-10-09 | Discharge: 2017-10-09 | Disposition: A | Discharge status: Home / Self Care | Payer: BLUE CROSS/BLUE SHIELD | Source: Ambulatory Visit | Attending: Internal Medicine | Admitting: Internal Medicine

## 2017-10-09 DIAGNOSIS — Z1231 Encounter for screening mammogram for malignant neoplasm of breast: Secondary | ICD-10-CM | POA: Insufficient documentation

## 2017-10-15 DIAGNOSIS — G9389 Other specified disorders of brain: Secondary | ICD-10-CM | POA: Diagnosis not present

## 2017-10-15 DIAGNOSIS — Z79899 Other long term (current) drug therapy: Secondary | ICD-10-CM | POA: Diagnosis not present

## 2017-10-15 DIAGNOSIS — J449 Chronic obstructive pulmonary disease, unspecified: Secondary | ICD-10-CM | POA: Diagnosis not present

## 2017-10-15 DIAGNOSIS — R918 Other nonspecific abnormal finding of lung field: Secondary | ICD-10-CM | POA: Diagnosis not present

## 2017-10-15 DIAGNOSIS — I714 Abdominal aortic aneurysm, without rupture: Secondary | ICD-10-CM | POA: Diagnosis not present

## 2017-10-15 DIAGNOSIS — L0211 Cutaneous abscess of neck: Secondary | ICD-10-CM | POA: Diagnosis not present

## 2017-10-15 DIAGNOSIS — F17210 Nicotine dependence, cigarettes, uncomplicated: Secondary | ICD-10-CM | POA: Diagnosis not present

## 2017-10-15 DIAGNOSIS — Z7982 Long term (current) use of aspirin: Secondary | ICD-10-CM | POA: Diagnosis not present

## 2017-10-15 DIAGNOSIS — G629 Polyneuropathy, unspecified: Secondary | ICD-10-CM | POA: Diagnosis not present

## 2017-10-15 DIAGNOSIS — I712 Thoracic aortic aneurysm, without rupture: Secondary | ICD-10-CM | POA: Diagnosis not present

## 2017-10-15 DIAGNOSIS — R911 Solitary pulmonary nodule: Secondary | ICD-10-CM | POA: Diagnosis not present

## 2017-10-15 DIAGNOSIS — D72829 Elevated white blood cell count, unspecified: Secondary | ICD-10-CM | POA: Diagnosis not present

## 2017-10-15 DIAGNOSIS — E041 Nontoxic single thyroid nodule: Secondary | ICD-10-CM | POA: Diagnosis not present

## 2017-10-15 DIAGNOSIS — I10 Essential (primary) hypertension: Secondary | ICD-10-CM | POA: Diagnosis not present

## 2017-10-16 DIAGNOSIS — R918 Other nonspecific abnormal finding of lung field: Secondary | ICD-10-CM | POA: Diagnosis not present

## 2017-10-16 DIAGNOSIS — L0211 Cutaneous abscess of neck: Secondary | ICD-10-CM | POA: Diagnosis not present

## 2017-10-16 DIAGNOSIS — I712 Thoracic aortic aneurysm, without rupture: Secondary | ICD-10-CM | POA: Diagnosis not present

## 2017-10-17 DIAGNOSIS — J449 Chronic obstructive pulmonary disease, unspecified: Secondary | ICD-10-CM | POA: Diagnosis not present

## 2017-10-17 DIAGNOSIS — L0211 Cutaneous abscess of neck: Secondary | ICD-10-CM | POA: Diagnosis not present

## 2017-10-17 DIAGNOSIS — I712 Thoracic aortic aneurysm, without rupture: Secondary | ICD-10-CM | POA: Diagnosis not present

## 2017-10-17 DIAGNOSIS — G9389 Other specified disorders of brain: Secondary | ICD-10-CM | POA: Diagnosis not present

## 2017-10-17 DIAGNOSIS — R918 Other nonspecific abnormal finding of lung field: Secondary | ICD-10-CM | POA: Diagnosis not present

## 2017-10-17 DIAGNOSIS — I714 Abdominal aortic aneurysm, without rupture: Secondary | ICD-10-CM | POA: Diagnosis not present

## 2017-10-19 DIAGNOSIS — L0211 Cutaneous abscess of neck: Secondary | ICD-10-CM | POA: Diagnosis not present

## 2017-10-19 DIAGNOSIS — Z4801 Encounter for change or removal of surgical wound dressing: Secondary | ICD-10-CM | POA: Diagnosis not present

## 2017-10-20 DIAGNOSIS — L0211 Cutaneous abscess of neck: Secondary | ICD-10-CM | POA: Diagnosis not present

## 2017-10-20 DIAGNOSIS — Z4801 Encounter for change or removal of surgical wound dressing: Secondary | ICD-10-CM | POA: Diagnosis not present

## 2017-10-21 DIAGNOSIS — L0211 Cutaneous abscess of neck: Secondary | ICD-10-CM | POA: Diagnosis not present

## 2017-10-21 DIAGNOSIS — Z4801 Encounter for change or removal of surgical wound dressing: Secondary | ICD-10-CM | POA: Diagnosis not present

## 2017-10-22 DIAGNOSIS — L723 Sebaceous cyst: Secondary | ICD-10-CM | POA: Diagnosis not present

## 2017-10-22 DIAGNOSIS — L040 Acute lymphadenitis of face, head and neck: Secondary | ICD-10-CM | POA: Diagnosis not present

## 2017-10-22 DIAGNOSIS — L089 Local infection of the skin and subcutaneous tissue, unspecified: Secondary | ICD-10-CM | POA: Diagnosis not present

## 2017-10-22 DIAGNOSIS — Z6827 Body mass index (BMI) 27.0-27.9, adult: Secondary | ICD-10-CM | POA: Diagnosis not present

## 2017-10-28 ENCOUNTER — Ambulatory Visit (HOSPITAL_COMMUNITY)
Admission: RE | Admit: 2017-10-28 | Discharge: 2017-10-28 | Disposition: A | Discharge status: Home / Self Care | Payer: BLUE CROSS/BLUE SHIELD | Source: Ambulatory Visit | Attending: Internal Medicine | Admitting: Internal Medicine

## 2017-10-28 DIAGNOSIS — Z79899 Other long term (current) drug therapy: Secondary | ICD-10-CM | POA: Insufficient documentation

## 2017-10-28 DIAGNOSIS — F17210 Nicotine dependence, cigarettes, uncomplicated: Secondary | ICD-10-CM | POA: Insufficient documentation

## 2017-10-28 DIAGNOSIS — R942 Abnormal results of pulmonary function studies: Secondary | ICD-10-CM | POA: Insufficient documentation

## 2017-10-28 DIAGNOSIS — R0602 Shortness of breath: Secondary | ICD-10-CM | POA: Insufficient documentation

## 2017-10-28 DIAGNOSIS — R918 Other nonspecific abnormal finding of lung field: Secondary | ICD-10-CM | POA: Diagnosis not present

## 2017-10-28 LAB — PULMONARY FUNCTION TEST
DL/VA % pred: 52 %
DL/VA: 2.62 ml/min/mmHg/L
DLCO UNC % PRED: 51 %
DLCO UNC: 13.96 ml/min/mmHg
FEF 25-75 PRE: 0.54 L/s
FEF 25-75 Post: 0.49 L/sec
FEF2575-%CHANGE-POST: -9 %
FEF2575-%Pred-Post: 18 %
FEF2575-%Pred-Pre: 20 %
FEV1-%Change-Post: -5 %
FEV1-%PRED-POST: 56 %
FEV1-%Pred-Pre: 59 %
FEV1-POST: 1.62 L
FEV1-Pre: 1.71 L
FEV1FVC-%Change-Post: -3 %
FEV1FVC-%Pred-Pre: 66 %
FEV6-%CHANGE-POST: -1 %
FEV6-%PRED-POST: 78 %
FEV6-%PRED-PRE: 79 %
FEV6-POST: 2.8 L
FEV6-PRE: 2.83 L
FEV6FVC-%CHANGE-POST: 0 %
FEV6FVC-%PRED-POST: 89 %
FEV6FVC-%Pred-Pre: 89 %
FVC-%CHANGE-POST: -1 %
FVC-%Pred-Post: 87 %
FVC-%Pred-Pre: 88 %
FVC-Post: 3.22 L
FVC-Pre: 3.27 L
POST FEV6/FVC RATIO: 87 %
Post FEV1/FVC ratio: 50 %
Pre FEV1/FVC ratio: 52 %
Pre FEV6/FVC Ratio: 87 %
RV % PRED: 158 %
RV: 3.14 L
TLC % PRED: 124 %
TLC: 6.64 L

## 2017-10-28 MED ORDER — ALBUTEROL SULFATE (2.5 MG/3ML) 0.083% IN NEBU
2.5000 mg | INHALATION_SOLUTION | Freq: Once | RESPIRATORY_TRACT | Status: AC
  Administered 2017-10-28: 2.5 mg via RESPIRATORY_TRACT

## 2017-10-29 DIAGNOSIS — G603 Idiopathic progressive neuropathy: Secondary | ICD-10-CM | POA: Diagnosis not present

## 2017-10-29 DIAGNOSIS — I5032 Chronic diastolic (congestive) heart failure: Secondary | ICD-10-CM | POA: Diagnosis not present

## 2017-10-29 DIAGNOSIS — I10 Essential (primary) hypertension: Secondary | ICD-10-CM | POA: Diagnosis not present

## 2017-10-29 DIAGNOSIS — R209 Unspecified disturbances of skin sensation: Secondary | ICD-10-CM | POA: Diagnosis not present

## 2017-10-29 DIAGNOSIS — M79603 Pain in arm, unspecified: Secondary | ICD-10-CM | POA: Diagnosis not present

## 2017-10-29 DIAGNOSIS — J449 Chronic obstructive pulmonary disease, unspecified: Secondary | ICD-10-CM | POA: Diagnosis not present

## 2017-10-29 DIAGNOSIS — M542 Cervicalgia: Secondary | ICD-10-CM | POA: Diagnosis not present

## 2017-10-29 DIAGNOSIS — J9612 Chronic respiratory failure with hypercapnia: Secondary | ICD-10-CM | POA: Diagnosis not present

## 2017-11-04 DIAGNOSIS — R1310 Dysphagia, unspecified: Secondary | ICD-10-CM | POA: Diagnosis not present

## 2017-11-04 DIAGNOSIS — L723 Sebaceous cyst: Secondary | ICD-10-CM | POA: Diagnosis not present

## 2017-11-04 DIAGNOSIS — L089 Local infection of the skin and subcutaneous tissue, unspecified: Secondary | ICD-10-CM | POA: Diagnosis not present

## 2017-11-05 ENCOUNTER — Encounter: Payer: Self-pay | Admitting: *Deleted

## 2017-11-05 ENCOUNTER — Other Ambulatory Visit: Payer: Self-pay | Admitting: *Deleted

## 2017-11-05 ENCOUNTER — Ambulatory Visit (INDEPENDENT_AMBULATORY_CARE_PROVIDER_SITE_OTHER): Payer: BLUE CROSS/BLUE SHIELD | Admitting: Gastroenterology

## 2017-11-05 ENCOUNTER — Encounter: Payer: Self-pay | Admitting: Gastroenterology

## 2017-11-05 ENCOUNTER — Telehealth: Payer: Self-pay | Admitting: *Deleted

## 2017-11-05 VITALS — BP 124/84 | HR 88 | Temp 97.7°F | Ht 66.0 in | Wt 153.4 lb

## 2017-11-05 DIAGNOSIS — Z800 Family history of malignant neoplasm of digestive organs: Secondary | ICD-10-CM | POA: Insufficient documentation

## 2017-11-05 DIAGNOSIS — Z1211 Encounter for screening for malignant neoplasm of colon: Secondary | ICD-10-CM

## 2017-11-05 MED ORDER — PEG 3350-KCL-NA BICARB-NACL 420 G PO SOLR: 4000.0000 mL | Freq: Once | ORAL | 0 refills | Status: AC

## 2017-11-05 NOTE — Patient Instructions (Signed)
We have arranged a colonoscopy with Dr. Rourk in the near future.  Further recommendations to follow!  It was a pleasure to see you today. I strive to create trusting relationships with patients to provide genuine, compassionate, and quality care. I value your feedback. If you receive a survey regarding your visit,  I greatly appreciate you taking time to fill this out.   Anna W. Boone, PhD, ANP-BC [facility]    

## 2017-11-05 NOTE — Assessment & Plan Note (Signed)
Sister diagnosed last year with metastatic disease at age 55.

## 2017-11-05 NOTE — H&P (View-Only) (Signed)
Primary Care Physician:  Celene Squibb, MD Primary Gastroenterologist:  Dr. Gala Romney   Chief Complaint  Patient presents with  . Colonoscopy    never had tcs  . Hemorrhoids    some bleeding when inflamed    HPI:   [name] is a 57 y.o. female presenting today at the request of Dr. Nevada Crane for initial screening colonoscopy. Her sister was diagnosed with metastatic colon cancer last year at age 38.   She was last seen here in 2012. History of pancreatitis secondary to ETOH. She no longer drinks ETOH. Recently at [facility] early October 2019 with infected sebaceous cyst of neck. CT neck completed with multiple pulmonary nodules. She is seeing Oncology for further evaluation ([facility]) and ENT due to neck cyst. Incidental finding of 4.7 cm ascending aortic aneurysm as well. PCP aware. Some intermittent bleeding when hemorrhoids inflamed. Occasional. Takes Miralax occasionally for mild constipation. Intentional weight loss. States she has been watching what she is eating and exercising.   Past Medical History:  Diagnosis Date  . Anxiety   . Arthritis   . COPD (chronic obstructive pulmonary disease) ([place])   . ETOH abuse   . Hemorrhoids   . Methamphetamine dependence ([place])    history of BSD and  . Pancreatitis 2012    Past Surgical History:  Procedure Laterality Date  . CHOLECYSTECTOMY  90's  . ERCP     in the past with Dr.Rourk for CBD stone?  . TUBAL LIGATION      Current Outpatient Medications  Medication Sig Dispense Refill  . albuterol (PROVENTIL HFA;VENTOLIN HFA) 108 (90 Base) MCG/ACT inhaler Inhale 2 puffs into the lungs every 4 (four) hours as needed for wheezing or shortness of breath (cough, shortness of breath or wheezing.). 1 Inhaler 1  . aspirin EC 325 MG tablet Take 325 mg by mouth daily as needed for mild pain.    . DULoxetine (CYMBALTA) 60 MG capsule Take 1 capsule by mouth daily.    . furosemide (LASIX) 40 MG tablet Take 1 tablet (40 mg total) by mouth  daily. 30 tablet 0  . HYDROcodone-acetaminophen (NORCO/VICODIN) 5-325 MG tablet Take 1 tablet by mouth as needed.  0  . ipratropium-albuterol (DUONEB) 0.5-2.5 (3) MG/3ML SOLN Take 3 mLs by nebulization every 4 (four) hours as needed (wheezing, coughing, SOB). 360 mL 0  . potassium chloride SA (K-DUR,KLOR-CON) 20 MEQ tablet Take 2 tablets (40 mEq total) by mouth daily. 60 tablet 0  . pregabalin (LYRICA) 75 MG capsule Take 75 mg by mouth 2 (two) times daily.  0  . traMADol (ULTRAM) 50 MG tablet Take 1-2 tablets by mouth every 6 (six) hours.  0  . polyethylene glycol-electrolytes (NULYTELY/GOLYTELY) 420 g solution Take 4,000 mLs by mouth once for 1 dose. 4000 mL 0   No current facility-administered medications for this visit.     Allergies as of 11/05/2017 - Review Complete 11/05/2017  Allergen Reaction Noted  . Lisinopril Hives   . Propoxyphene n-acetaminophen Itching     Family History  Problem Relation Age of Onset  . Colon cancer Sister 58    Social History   Socioeconomic History  . Marital status: Married    Spouse name: Not on file  . Number of children: Not on file  . Years of education: Not on file  . Highest education level: Not on file  Occupational History  . Not on file  Social Needs  . Financial resource strain: Not on file  .  Food insecurity:    Worry: Not on file    Inability: Not on file  . Transportation needs:    Medical: Not on file    Non-medical: Not on file  Tobacco Use  . Smoking status: Current Every Day Smoker    Packs/day: 0.50    Types: Cigarettes  . Smokeless tobacco: Never Used  . Tobacco comment: 2 a day per pt report  Substance and Sexual Activity  . Alcohol use: Not Currently    Frequency: Never    Comment: occas  . Drug use: Not Currently    Types: Methamphetamines    Comment: on drug list HX  . Sexual activity: Not on file  Lifestyle  . Physical activity:    Days per week: Not on file    Minutes per session: Not on file  .  Stress: Not on file  Relationships  . Social connections:    Talks on phone: Not on file    Gets together: Not on file    Attends religious service: Not on file    Active member of club or organization: Not on file    Attends meetings of clubs or organizations: Not on file    Relationship status: Not on file  . Intimate partner violence:    Fear of current or ex partner: Not on file    Emotionally abused: Not on file    Physically abused: Not on file    Forced sexual activity: Not on file  Other Topics Concern  . Not on file  Social History Narrative  . Not on file    Review of Systems: Gen: Denies any fever, chills, fatigue, weight loss, lack of appetite.  CV: Denies chest pain, heart palpitations, peripheral edema, syncope.  Resp: Denies shortness of breath at rest or with exertion. Denies wheezing or cough.  GI: see HPI  GU : Denies urinary burning, urinary frequency, urinary hesitancy MS: Denies joint pain, muscle weakness, cramps, or limitation of movement.  Derm: Denies rash, itching, dry skin Psych: Denies depression, anxiety, memory loss, and confusion Heme: see HPI   Physical Exam: BP 124/84   Pulse 88   Temp 97.7 F (36.5 C) (Oral)   Ht 5\' 6"  (1.676 m)   Wt 153 lb 6.4 oz (69.6 kg)   BMI 24.76 kg/m  General:   Alert and oriented. Pleasant and cooperative. Well-nourished and well-developed.  Head:  Normocephalic and atraumatic. Eyes:  Without icterus, sclera clear and conjunctiva pink.  Ears:  Normal auditory acuity. Nose:  No deformity, discharge,  or lesions. Mouth:  No deformity or lesions, oral mucosa pink.  Lungs:  Clear to auscultation bilaterally. No wheezes, rales, or rhonchi. No distress.  Heart:  S1, S2 present without murmurs appreciated.  Abdomen:  +BS, soft, non-tender and non-distended. No HSM noted. No guarding or rebound. No masses appreciated.  Rectal:  Deferred  Msk:  Symmetrical without gross deformities. Normal posture. Extremities:   Without  edema. Neurologic:  Alert and  oriented x4 Psych:  Alert and cooperative. Normal mood and affect.

## 2017-11-05 NOTE — Progress Notes (Signed)
Primary Care Physician:  Celene Squibb, MD Primary Gastroenterologist:  Dr. Gala Romney   Chief Complaint  Patient presents with  . Colonoscopy    never had tcs  . Hemorrhoids    some bleeding when inflamed    HPI:   [name] is a 57 y.o. female presenting today at the request of Dr. [place] Crane for initial screening colonoscopy. Her sister was diagnosed with metastatic colon cancer last year at age 48.   She was last seen here in 2012. History of pancreatitis secondary to ETOH. She no longer drinks ETOH. Recently at [facility] early October 2019 with infected sebaceous cyst of neck. CT neck completed with multiple pulmonary nodules. She is seeing Oncology for further evaluation (Care Everywhere) and ENT due to neck cyst. Incidental finding of 4.7 cm ascending aortic aneurysm as well. PCP aware. Some intermittent bleeding when hemorrhoids inflamed. Occasional. Takes Miralax occasionally for mild constipation. Intentional weight loss. States she has been watching what she is eating and exercising.   Past Medical History:  Diagnosis Date  . Anxiety   . Arthritis   . COPD (chronic obstructive pulmonary disease) ([place])   . ETOH abuse   . Hemorrhoids   . 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 Pancreatitis 2012    Past Surgical History:  Procedure Laterality Date  . CHOLECYSTECTOMY  90's  . ERCP     in the past with Dr.Rourk for CBD stone?  . TUBAL LIGATION      Current Outpatient Medications  Medication Sig Dispense Refill  . albuterol (PROVENTIL HFA;VENTOLIN HFA) 108 (90 Base) MCG/ACT inhaler Inhale 2 puffs into the lungs every 4 (four) hours as needed for wheezing or shortness of breath (cough, shortness of breath or wheezing.). 1 Inhaler 1  . aspirin EC 325 MG tablet Take 325 mg by mouth daily as needed for mild pain.    . DULoxetine (CYMBALTA) 60 MG capsule Take 1 capsule by mouth daily.    . furosemide (LASIX) 40 MG tablet Take 1 tablet (40 mg total) by mouth  daily. 30 tablet 0  . HYDROcodone-acetaminophen (NORCO/VICODIN) 5-325 MG tablet Take 1 tablet by mouth as needed.  0  . ipratropium-albuterol (DUONEB) 0.5-2.5 (3) MG/3ML SOLN Take 3 mLs by nebulization every 4 (four) hours as needed (wheezing, coughing, SOB). 360 mL 0  . potassium chloride SA (K-DUR,KLOR-CON) 20 MEQ tablet Take 2 tablets (40 mEq total) by mouth daily. 60 tablet 0  . pregabalin (LYRICA) 75 MG capsule Take 75 mg by mouth 2 (two) times daily.  0  . traMADol (ULTRAM) 50 MG tablet Take 1-2 tablets by mouth every 6 (six) hours.  0  . polyethylene glycol-electrolytes (NULYTELY/GOLYTELY) 420 g solution Take 4,000 mLs by mouth once for 1 dose. 4000 mL 0   No current facility-administered medications for this visit.     Allergies as of 11/05/2017 - Review Complete 11/05/2017  Allergen Reaction Noted  . Lisinopril Hives   . Propoxyphene n-acetaminophen Itching     Family History  Problem Relation Age of Onset  . Colon cancer Sister 43    Social History   Socioeconomic History  . Marital status: Married    Spouse name: Not on file  . Number of children: Not on file  . Years of education: Not on file  . Highest education level: Not on file  Occupational History  . Not on file  Social Needs  . Financial resource strain: Not on file  .  Food insecurity:    Worry: Not on file    Inability: Not on file  . Transportation needs:    Medical: Not on file    Non-medical: Not on file  Tobacco Use  . Smoking status: Current Every Day Smoker    Packs/day: 0.50    Types: Cigarettes  . Smokeless tobacco: Never Used  . Tobacco comment: 2 a day per pt report  Substance and Sexual Activity  . Alcohol use: Not Currently    Frequency: Never    Comment: occas  . Drug use: Not Currently    Types: Methamphetamines    Comment: on drug list HX  . Sexual activity: Not on file  Lifestyle  . Physical activity:    Days per week: Not on file    Minutes per session: Not on file  .  Stress: Not on file  Relationships  . Social connections:    Talks on phone: Not on file    Gets together: Not on file    Attends religious service: Not on file    Active member of club or organization: Not on file    Attends meetings of clubs or organizations: Not on file    Relationship status: Not on file  . Intimate partner violence:    Fear of current or ex partner: Not on file    Emotionally abused: Not on file    Physically abused: Not on file    Forced sexual activity: Not on file  Other Topics Concern  . Not on file  Social History Narrative  . Not on file    Review of Systems: Gen: Denies any fever, chills, fatigue, weight loss, lack of appetite.  CV: Denies chest pain, heart palpitations, peripheral edema, syncope.  Resp: Denies shortness of breath at rest or with exertion. Denies wheezing or cough.  GI: see HPI  GU : Denies urinary burning, urinary frequency, urinary hesitancy MS: Denies joint pain, muscle weakness, cramps, or limitation of movement.  Derm: Denies rash, itching, dry skin Psych: Denies depression, anxiety, memory loss, and confusion Heme: see HPI   Physical Exam: BP 124/84   Pulse 88   Temp 97.7 F (36.5 C) (Oral)   Ht 5\' 6"  (1.676 m)   Wt 153 lb 6.4 oz (69.6 kg)   BMI 24.76 kg/m  General:   Alert and oriented. Pleasant and cooperative. Well-nourished and well-developed.  Head:  Normocephalic and atraumatic. Eyes:  Without icterus, sclera clear and conjunctiva pink.  Ears:  Normal auditory acuity. Nose:  No deformity, discharge,  or lesions. Mouth:  No deformity or lesions, oral mucosa pink.  Lungs:  Clear to auscultation bilaterally. No wheezes, rales, or rhonchi. No distress.  Heart:  S1, S2 present without murmurs appreciated.  Abdomen:  +BS, soft, non-tender and non-distended. No HSM noted. No guarding or rebound. No masses appreciated.  Rectal:  Deferred  Msk:  Symmetrical without gross deformities. Normal posture. Extremities:   Without  edema. Neurologic:  Alert and  oriented x4 Psych:  Alert and cooperative. Normal mood and affect.

## 2017-11-05 NOTE — Progress Notes (Signed)
cc'ed to pcp °

## 2017-11-05 NOTE — Telephone Encounter (Signed)
Pre-op scheduled for 11/17/17 at 11:30am. Patient aware. Letter mailed.

## 2017-11-05 NOTE — Assessment & Plan Note (Signed)
57 year old female with no prior colonoscopy, rare chronic low-volume hematochezia likely benign anorectal source, otherwise no other concerning lower GI signs/symptoms. Unfortunately, her sister was diagnosed at age 53 with metastatic colon cancer last year. No other family members with colon cancer or colon polyps.   Proceed with TCS with Dr. Gala Romney in near future: the risks, benefits, and alternatives have been discussed with the patient in detail. The patient states understanding and desires to proceed. Propofol due to polypharmacy

## 2017-11-11 NOTE — Patient Instructions (Signed)
[name]  11/11/2017     @PREFPERIOPPHARMACY @   Your procedure is scheduled on  11/23/2017 .  Report to [facility] at  [place]   PM.  Call this number if you have problems the morning of surgery:  [phone]   Remember:  Follow the diet and prep instructions given to [facility] by Dr Roseanne Kaufman office.                       Take these medicines the morning of surgery with A SIP OF WATER  Cymbalta,hydrocodone( if needed), lyrica, tramadol. Use your inhaler and your nebulizer before you come.    Do not wear jewelry, make-up or nail polish.  Do not wear lotions, powders, or perfumes, or deodorant.  Do not shave 48 hours prior to surgery.  Men may shave face and neck.  Do not bring valuables to the hospital.  [facility] is not responsible for any belongings or valuables.  Contacts, dentures or bridgework may not be worn into surgery.  Leave your suitcase in the car.  After surgery it may be brought to your room.  For patients admitted to the hospital, discharge time will be determined by your treatment team.  Patients discharged the day of surgery will not be allowed to drive home.   Name and phone number of your driver:   family Special instructions:  None  Please read over the following fact sheets that you were given. Anesthesia Post-op Instructions and Care and Recovery After Surgery       Colonoscopy, Adult A colonoscopy is an exam to look at the large intestine. It is done to check for problems, such as:  Lumps (tumors).  Growths (polyps).  Swelling (inflammation).  Bleeding.  What happens before the procedure? Eating and drinking Follow instructions from your doctor about eating and drinking. These instructions may include:  A few days before the procedure - follow a low-fiber diet. ? Avoid nuts. ? Avoid seeds. ? Avoid dried fruit. ? Avoid raw fruits. ? Avoid vegetables.  1-3 days before the procedure - follow a clear liquid diet. Avoid  liquids that have red or purple dye. Drink only clear liquids, such as: ? Clear broth or bouillon. ? Black coffee or tea. ? Clear juice. ? Clear soft drinks or sports drinks. ? Gelatin dessert. ? Popsicles.  On the day of the procedure - do not eat or drink anything during the 2 hours before the procedure.  Bowel prep If you were prescribed an oral bowel prep:  Take it as told by your doctor. Starting the day before your procedure, you will need to drink a lot of liquid. The liquid will cause you to poop (have bowel movements) until your poop is almost clear or light green.  If your skin or butt gets irritated from diarrhea, you may: ? Wipe the area with wipes that have medicine in them, such as adult wet wipes with aloe and vitamin E. ? Put something on your skin that soothes the area, such as petroleum jelly.  If you throw up (vomit) while drinking the bowel prep, take a break for up to 60 minutes. Then begin the bowel prep again. If you keep throwing up and you cannot take the bowel prep without throwing up, call your doctor.  General instructions  Ask your doctor about changing or stopping your normal medicines. This is important if you take diabetes medicines or  blood thinners.  Plan to have someone take you home from the hospital or clinic. What happens during the procedure?  An IV tube may be put into one of your veins.  You will be given medicine to help you relax (sedative).  To reduce your risk of infection: ? Your doctors will wash their hands. ? Your anal area will be washed with soap.  You will be asked to lie on your side with your knees bent.  Your doctor will get a long, thin, flexible tube ready. The tube will have a camera and a light on the end.  The tube will be put into your anus.  The tube will be gently put into your large intestine.  Air will be delivered into your large intestine to keep it open. You may feel some pressure or cramping.  The  camera will be used to take photos.  A small tissue sample may be removed from your body to be looked at under a microscope (biopsy). If any possible problems are found, the tissue will be sent to a lab for testing.  If small growths are found, your doctor may remove them and have them checked for cancer.  The tube that was put into your anus will be slowly removed. The procedure may vary among doctors and hospitals. What happens after the procedure?  Your doctor will check on you often until the medicines you were given have worn off.  Do not drive for 24 hours after the procedure.  You may have a small amount of blood in your poop.  You may pass gas.  You may have mild cramps or bloating in your belly (abdomen).  It is up to you to get the results of your procedure. Ask your doctor, or the department performing the procedure, when your results will be ready. This information is not intended to replace advice given to you by your health care provider. Make sure you discuss any questions you have with your health care provider. Document Released: 02/01/2010 Document Revised: 10/31/2015 Document Reviewed: 03/13/2015 Elsevier Interactive Patient Education  2017 Elsevier Inc[employer]  Colonoscopy, Adult, Care After This sheet gives you information about how to care for yourself after your procedure. Your health care provider may also give you more specific instructions. If you have problems or questions, contact your health care provider. What can I expect after the procedure? After the procedure, it is common to have:  A small amount of blood in your stool for 24 hours after the procedure.  Some gas.  Mild abdominal cramping or bloating.  Follow these instructions at home: General instructions   For the first 24 hours after the procedure: ? Do not drive or use machinery. ? Do not sign important documents. ? Do not drink alcohol. ? Do your regular daily activities at a slower pace  than normal. ? Eat soft, easy-to-digest foods. ? Rest often.  Take over-the-counter or prescription medicines only as told by your health care provider.  It is up to you to get the results of your procedure. Ask your health care provider, or the department performing the procedure, when your results will be ready. Relieving cramping and bloating  Try walking around when you have cramps or feel bloated.  Apply heat to your abdomen as told by your health care provider. Use a heat source that your health care provider recommends, such as a moist heat pack or a heating pad. ? Place a towel between your skin and the heat  source. ? Leave the heat on for 20-30 minutes. ? Remove the heat if your skin turns bright red. This is especially important if you are unable to feel pain, heat, or cold. You may have a greater risk of getting burned. Eating and drinking  Drink enough fluid to keep your urine clear or pale yellow.  Resume your normal diet as instructed by your health care provider. Avoid heavy or fried foods that are hard to digest.  Avoid drinking alcohol for as long as instructed by your health care provider. Contact a health care provider if:  You have blood in your stool 2-3 days after the procedure. Get help right away if:  You have more than a small spotting of blood in your stool.  You pass large blood clots in your stool.  Your abdomen is swollen.  You have nausea or vomiting.  You have a fever.  You have increasing abdominal pain that is not relieved with medicine. This information is not intended to replace advice given to you by your health care provider. Make sure you discuss any questions you have with your health care provider. Document Released: 08/14/2003 Document Revised: 09/24/2015 Document Reviewed: 03/13/2015 Elsevier Interactive Patient Education  2018 [place] Anesthesia is a term that refers to techniques, procedures, and  medicines that help a person stay safe and comfortable during a medical procedure. Monitored anesthesia care, or sedation, is one type of anesthesia. Your anesthesia specialist may recommend sedation if you will be having a procedure that does not require you to be unconscious, such as:  Cataract surgery.  A dental procedure.  A biopsy.  A colonoscopy.  During the procedure, you may receive a medicine to help you relax (sedative). There are three levels of sedation:  Mild sedation. At this level, you may feel awake and relaxed. You will be able to follow directions.  Moderate sedation. At this level, you will be sleepy. You may not remember the procedure.  Deep sedation. At this level, you will be asleep. You will not remember the procedure.  The more medicine you are given, the deeper your level of sedation will be. Depending on how you respond to the procedure, the anesthesia specialist may change your level of sedation or the type of anesthesia to fit your needs. An anesthesia specialist will monitor you closely during the procedure. Let your health care provider know about:  Any allergies you have.  All medicines you are taking, including vitamins, herbs, eye drops, creams, and over-the-counter medicines.  Any use of steroids (by mouth or as a cream).  Any problems you or family members have had with sedatives and anesthetic medicines.  Any blood disorders you have.  Any surgeries you have had.  Any medical conditions you have, such as sleep apnea.  Whether you are pregnant or may be pregnant.  Any use of cigarettes, alcohol, or street drugs. What are the risks? Generally, this is a safe procedure. However, problems may occur, including:  Getting too much medicine (oversedation).  Nausea.  Allergic reaction to medicines.  Trouble breathing. If this happens, a breathing tube may be used to help with breathing. It will be removed when you are awake and breathing on  your own.  Heart trouble.  Lung trouble.  Before the procedure Staying hydrated Follow instructions from your health care provider about hydration, which may include:  Up to 2 hours before the procedure - you may continue to drink clear liquids, such as  water, clear fruit juice, black coffee, and plain tea.  Eating and drinking restrictions Follow instructions from your health care provider about eating and drinking, which may include:  8 hours before the procedure - stop eating heavy meals or foods such as meat, fried foods, or fatty foods.  6 hours before the procedure - stop eating light meals or foods, such as toast or cereal.  6 hours before the procedure - stop drinking milk or drinks that contain milk.  2 hours before the procedure - stop drinking clear liquids.  Medicines Ask your health care provider about:  Changing or stopping your regular medicines. This is especially important if you are taking diabetes medicines or blood thinners.  Taking medicines such as aspirin and ibuprofen. These medicines can thin your blood. Do not take these medicines before your procedure if your health care provider instructs you not to.  Tests and exams  You will have a physical exam.  You may have blood tests done to show: ? How well your kidneys and liver are working. ? How well your blood can clot.  General instructions  Plan to have someone take you home from the hospital or clinic.  If you will be going home right after the procedure, plan to have someone with you for 24 hours.  What happens during the procedure?  Your blood pressure, heart rate, breathing, level of pain and overall condition will be monitored.  An IV tube will be inserted into one of your veins.  Your anesthesia specialist will give you medicines as needed to keep you comfortable during the procedure. This may mean changing the level of sedation.  The procedure will be performed. After the  procedure  Your blood pressure, heart rate, breathing rate, and blood oxygen level will be monitored until the medicines you were given have worn off.  Do not drive for 24 hours if you received a sedative.  You may: ? Feel sleepy, clumsy, or nauseous. ? Feel forgetful about what happened after the procedure. ? Have a sore throat if you had a breathing tube during the procedure. ? Vomit. This information is not intended to replace advice given to you by your health care provider. Make sure you discuss any questions you have with your health care provider. Document Released: 09/25/2004 Document Revised: 06/08/2015 Document Reviewed: 04/22/2015 Elsevier Interactive Patient Education  2018 [place], Care After These instructions provide you with information about caring for yourself after your procedure. Your health care provider may also give you more specific instructions. Your treatment has been planned according to current medical practices, but problems sometimes occur. Call your health care provider if you have any problems or questions after your procedure. What can I expect after the procedure? After your procedure, it is common to:  Feel sleepy for several hours.  Feel clumsy and have poor balance for several hours.  Feel forgetful about what happened after the procedure.  Have poor judgment for several hours.  Feel nauseous or vomit.  Have a sore throat if you had a breathing tube during the procedure.  Follow these instructions at home: For at least 24 hours after the procedure:   Do not: ? Participate in activities in which you could fall or become injured. ? Drive. ? Use heavy machinery. ? Drink alcohol. ? Take sleeping pills or medicines that cause drowsiness. ? Make important decisions or sign legal documents. ? Take care of children on your own.  Rest. Eating and  drinking  Follow the diet that is recommended by your health  care provider.  If you vomit, drink water, juice, or soup when you can drink without vomiting.  Make sure you have little or no nausea before eating solid foods. General instructions  Have a responsible adult stay with you until you are awake and alert.  Take over-the-counter and prescription medicines only as told by your health care provider.  If you smoke, do not smoke without supervision.  Keep all follow-up visits as told by your health care provider. This is important. Contact a health care provider if:  You keep feeling nauseous or you keep vomiting.  You feel light-headed.  You develop a rash.  You have a fever. Get help right away if:  You have trouble breathing. This information is not intended to replace advice given to you by your health care provider. Make sure you discuss any questions you have with your health care provider. Document Released: 04/22/2015 Document Revised: 08/22/2015 Document Reviewed: 04/22/2015 Elsevier Interactive Patient Education  [employer].

## 2017-11-13 ENCOUNTER — Telehealth: Payer: Self-pay

## 2017-11-13 NOTE — Telephone Encounter (Signed)
Pt called office and LMOVM. Has questions about upcoming TCS. Tried to call pt, no answer, LMOVM for return call.

## 2017-11-17 ENCOUNTER — Encounter (HOSPITAL_COMMUNITY)
Admission: RE | Admit: 2017-11-17 | Discharge: 2017-11-17 | Disposition: A | Discharge status: Home / Self Care | Payer: BLUE CROSS/BLUE SHIELD | Source: Ambulatory Visit | Attending: Internal Medicine | Admitting: Internal Medicine

## 2017-11-19 ENCOUNTER — Encounter (HOSPITAL_COMMUNITY)
Admission: RE | Admit: 2017-11-19 | Discharge: 2017-11-19 | Disposition: A | Discharge status: Home / Self Care | Payer: BLUE CROSS/BLUE SHIELD | Source: Ambulatory Visit | Attending: Internal Medicine | Admitting: Internal Medicine

## 2017-11-19 ENCOUNTER — Encounter (HOSPITAL_COMMUNITY): Payer: Self-pay

## 2017-11-23 ENCOUNTER — Ambulatory Visit (HOSPITAL_COMMUNITY): Payer: BLUE CROSS/BLUE SHIELD | Admitting: Anesthesiology

## 2017-11-23 ENCOUNTER — Other Ambulatory Visit: Payer: Self-pay

## 2017-11-23 ENCOUNTER — Encounter (HOSPITAL_COMMUNITY): Payer: Self-pay | Admitting: *Deleted

## 2017-11-23 ENCOUNTER — Ambulatory Visit (HOSPITAL_COMMUNITY)
Admission: RE | Admit: 2017-11-23 | Discharge: 2017-11-23 | Disposition: A | Discharge status: Home / Self Care | Payer: BLUE CROSS/BLUE SHIELD | Source: Ambulatory Visit | Attending: Internal Medicine | Admitting: Internal Medicine

## 2017-11-23 ENCOUNTER — Encounter (HOSPITAL_COMMUNITY)
Admission: RE | Disposition: A | Discharge status: Home / Self Care | Payer: Self-pay | Source: Ambulatory Visit | Attending: Internal Medicine

## 2017-11-23 DIAGNOSIS — F17210 Nicotine dependence, cigarettes, uncomplicated: Secondary | ICD-10-CM | POA: Diagnosis not present

## 2017-11-23 DIAGNOSIS — M1990 Unspecified osteoarthritis, unspecified site: Secondary | ICD-10-CM | POA: Diagnosis not present

## 2017-11-23 DIAGNOSIS — I712 Thoracic aortic aneurysm, without rupture: Secondary | ICD-10-CM | POA: Insufficient documentation

## 2017-11-23 DIAGNOSIS — Z7982 Long term (current) use of aspirin: Secondary | ICD-10-CM | POA: Diagnosis not present

## 2017-11-23 DIAGNOSIS — J449 Chronic obstructive pulmonary disease, unspecified: Secondary | ICD-10-CM | POA: Insufficient documentation

## 2017-11-23 DIAGNOSIS — Z800 Family history of malignant neoplasm of digestive organs: Secondary | ICD-10-CM | POA: Insufficient documentation

## 2017-11-23 DIAGNOSIS — Z79899 Other long term (current) drug therapy: Secondary | ICD-10-CM | POA: Insufficient documentation

## 2017-11-23 DIAGNOSIS — I110 Hypertensive heart disease with heart failure: Secondary | ICD-10-CM | POA: Diagnosis not present

## 2017-11-23 DIAGNOSIS — Z9049 Acquired absence of other specified parts of digestive tract: Secondary | ICD-10-CM | POA: Diagnosis not present

## 2017-11-23 DIAGNOSIS — Z7951 Long term (current) use of inhaled steroids: Secondary | ICD-10-CM | POA: Insufficient documentation

## 2017-11-23 DIAGNOSIS — F419 Anxiety disorder, unspecified: Secondary | ICD-10-CM | POA: Insufficient documentation

## 2017-11-23 DIAGNOSIS — D124 Benign neoplasm of descending colon: Secondary | ICD-10-CM | POA: Insufficient documentation

## 2017-11-23 DIAGNOSIS — D120 Benign neoplasm of cecum: Secondary | ICD-10-CM | POA: Diagnosis not present

## 2017-11-23 DIAGNOSIS — I509 Heart failure, unspecified: Secondary | ICD-10-CM | POA: Diagnosis not present

## 2017-11-23 DIAGNOSIS — Z1211 Encounter for screening for malignant neoplasm of colon: Secondary | ICD-10-CM | POA: Insufficient documentation

## 2017-11-23 DIAGNOSIS — K5900 Constipation, unspecified: Secondary | ICD-10-CM | POA: Insufficient documentation

## 2017-11-23 DIAGNOSIS — K644 Residual hemorrhoidal skin tags: Secondary | ICD-10-CM | POA: Insufficient documentation

## 2017-11-23 HISTORY — PX: POLYPECTOMY: SHX5525

## 2017-11-23 HISTORY — PX: COLONOSCOPY WITH PROPOFOL: SHX5780

## 2017-11-23 HISTORY — DX: Aortic aneurysm of unspecified site, without rupture: I71.9

## 2017-11-23 SURGERY — COLONOSCOPY WITH PROPOFOL
Anesthesia: Monitor Anesthesia Care

## 2017-11-23 MED ORDER — PROMETHAZINE HCL 25 MG/ML IJ SOLN: 6.2500 mg | INTRAMUSCULAR | Status: DC | PRN

## 2017-11-23 MED ORDER — STERILE WATER FOR IRRIGATION IR SOLN
Status: DC | PRN
  Administered 2017-11-23: 1.5 mL

## 2017-11-23 MED ORDER — MIDAZOLAM HCL 5 MG/5ML IJ SOLN
INTRAMUSCULAR | Status: DC | PRN
  Administered 2017-11-23: 2 mg via INTRAVENOUS

## 2017-11-23 MED ORDER — CHLORHEXIDINE GLUCONATE CLOTH 2 % EX PADS: 6.0000 | MEDICATED_PAD | Freq: Once | CUTANEOUS | Status: DC

## 2017-11-23 MED ORDER — PROPOFOL 10 MG/ML IV BOLUS
INTRAVENOUS | Status: DC | PRN
  Administered 2017-11-23 (×4): 20 mg via INTRAVENOUS

## 2017-11-23 MED ORDER — LACTATED RINGERS IV SOLN: INTRAVENOUS | Status: DC

## 2017-11-23 MED ORDER — MEPERIDINE HCL 100 MG/ML IJ SOLN: 6.2500 mg | INTRAMUSCULAR | Status: DC | PRN

## 2017-11-23 MED ORDER — HYDROCODONE-ACETAMINOPHEN 7.5-325 MG PO TABS: 1.0000 | ORAL_TABLET | Freq: Once | ORAL | Status: DC | PRN

## 2017-11-23 MED ORDER — PROPOFOL 10 MG/ML IV BOLUS
INTRAVENOUS | Status: AC
  Filled 2017-11-23: qty 40

## 2017-11-23 MED ORDER — HYDROMORPHONE HCL 1 MG/ML IJ SOLN: 0.2500 mg | INTRAMUSCULAR | Status: DC | PRN

## 2017-11-23 MED ORDER — LACTATED RINGERS IV SOLN
INTRAVENOUS | Status: DC
  Administered 2017-11-23: 13:00:00 via INTRAVENOUS

## 2017-11-23 MED ORDER — PROPOFOL 500 MG/50ML IV EMUL
INTRAVENOUS | Status: DC | PRN
  Administered 2017-11-23: 15:00:00 via INTRAVENOUS
  Administered 2017-11-23: 150 ug/kg/min via INTRAVENOUS

## 2017-11-23 MED ORDER — MIDAZOLAM HCL 2 MG/2ML IJ SOLN
INTRAMUSCULAR | Status: AC
  Filled 2017-11-23: qty 2

## 2017-11-23 NOTE — Op Note (Signed)
[facility] Patient Name: [name] Procedure Date: 11/23/2017 2:02 PM MRN: [id number] Date of Birth: 1960/07/30 Attending MD: Norvel Richards , MD CSN: [id number] Age: 57 Admit Type: Outpatient Procedure:                Colonoscopy Indications:              Screening in patient at increased risk: Family                            history of 1st-degree relative with colorectal                            cancer before age 80 years Providers:                Norvel Richards, MD, Lurline Del, RN, Nelma Rothman, Technician Referring MD:              Medicines:                Propofol per Anesthesia Complications:            No immediate complications. Estimated Blood Loss:     Estimated blood loss was minimal. Procedure:                Pre-Anesthesia Assessment:                           - Prior to the procedure, a History and Physical                            was performed, and patient medications and                            allergies were reviewed. The patient's tolerance of                            previous anesthesia was also reviewed. The risks                            and benefits of the procedure and the sedation                            options and risks were discussed with the patient.                            All questions were answered, and informed consent                            was obtained. Prior Anticoagulants: The patient has                            taken no previous anticoagulant or antiplatelet  agents. ASA Grade Assessment: II - A patient with                            mild systemic disease. After reviewing the risks                            and benefits, the patient was deemed in                            satisfactory condition to undergo the procedure.                           After obtaining informed consent, the colonoscope                            was passed under direct  vision. Throughout the                            procedure, the patient's blood pressure, pulse, and                            oxygen saturations were monitored continuously. The                            [id number]-[id number] ([id number]) scope was introduced through                            the and advanced to the the cecum, identified by                            appendiceal orifice and ileocecal valve. Scope In: 2:28:35 PM Scope Out: 2:43:42 PM Scope Withdrawal Time: 0 hours 9 minutes 27 seconds  Total Procedure Duration: 0 hours 15 minutes 7 seconds  Findings:      The perianal and digital rectal examinations were normal.      Two sessile polyps were found in the descending colon and ileocecal       valve. The polyps were 5 to 7 mm in size. These polyps were removed with       a cold snare. Resection and retrieval were complete. Estimated blood       loss was minimal.      The exam was otherwise without abnormality on direct and retroflexion       views. Impression:               - Two 5 to 7 mm polyps in the descending colon and                            at the ileocecal valve, removed with a cold snare.                            Resected and retrieved.                           - The examination was otherwise normal on direct  and retroflexion views. Moderate Sedation:      Moderate (conscious) sedation was personally administered by an       anesthesia professional. The following parameters were monitored: oxygen       saturation, heart rate, blood pressure, respiratory rate, EKG, adequacy       of pulmonary ventilation, and response to care. Recommendation:           - Patient has a contact number available for                            emergencies. The signs and symptoms of potential                            delayed complications were discussed with the                            patient. Return to normal activities tomorrow.                             Written discharge instructions were provided to the                            patient.                           - Advance diet as tolerated.                           - Continue present medications.                           - Repeat colonoscopy date to be determined after                            pending pathology results are reviewed for                            surveillance.                           - Return to GI office (date not yet determined). Procedure Code(s):        --- Professional ---                           [phone], Colonoscopy, flexible; with removal of                            tumor(s), polyp(s), or other lesion(s) by snare                            technique Diagnosis Code(s):        --- Professional ---                           Z80.0, Family history of malignant neoplasm of  digestive organs                           D12.4, Benign neoplasm of descending colon                           D12.0, Benign neoplasm of cecum CPT copyright 2018 American Medical Association. All rights reserved. The codes documented in this report are preliminary and upon coder review may  be revised to meet current compliance requirements. Cristopher Estimable. Aman Batley, MD Norvel Richards, MD 11/23/2017 2:53:53 PM This report has been signed electronically. Number of Addenda: 0

## 2017-11-23 NOTE — Transfer of Care (Signed)
Immediate Anesthesia Transfer of Care Note  Patient: [name]  Procedure(s) Performed: COLONOSCOPY WITH PROPOFOL (N/A ) POLYPECTOMY  Patient Location: PACU  Anesthesia Type:MAC  Level of Consciousness: awake  Airway & Oxygen Therapy: Patient Spontanous Breathing  Post-op Assessment: Report given to RN  Post vital signs: Reviewed  Last Vitals:  Vitals Value Taken Time  BP 115/76 11/23/2017  2:51 PM  Temp    Pulse 75 11/23/2017  2:54 PM  Resp 17 11/23/2017  2:54 PM  SpO2 100 % 11/23/2017  2:54 PM  Vitals shown include unvalidated device data.  Last Pain:  Vitals:   11/23/17 1315  TempSrc: Oral  PainSc: 4       Patients Stated Pain Goal: 9 (32/12/24 8250)  Complications: No apparent anesthesia complications

## 2017-11-23 NOTE — Interval H&P Note (Signed)
History and Physical Interval Note:  11/23/2017 2:09 PM  [name]  has presented today for surgery, with the diagnosis of screening colonoscopy  The various methods of treatment have been discussed with the patient and family. After consideration of risks, benefits and other options for treatment, the patient has consented to  Procedure(s) with comments: COLONOSCOPY WITH PROPOFOL (N/A) - 1:45pm as a surgical intervention .  The patient's history has been reviewed, patient examined, no change in status, stable for surgery.  I have reviewed the patient's chart and labs.  Questions were answered to the patient's satisfaction.      Patient seen and examined.  No change.  High risk screening colonoscopy per plan.  The risks, benefits, limitations, alternatives and imponderables have been reviewed with the patient. Questions have been answered. All parties are agreeable.   Manus Rudd

## 2017-11-23 NOTE — Discharge Instructions (Signed)
Colonoscopy Discharge Instructions  Read the instructions outlined below and refer to this sheet in the next few weeks. These discharge instructions provide you with general information on caring for yourself after you leave the hospital. Your doctor may also give you specific instructions. While your treatment has been planned according to the most current medical practices available, unavoidable complications occasionally occur. If you have any problems or questions after discharge, call Dr. Gala Romney at [phone]. ACTIVITY  You may resume your regular activity, but move at a slower pace for the next 24 hours.   Take frequent rest periods for the next 24 hours.   Walking will help get rid of the air and reduce the bloated feeling in your belly (abdomen).   No driving for 24 hours (because of the medicine (anesthesia) used during the test).    Do not sign any important legal documents or operate any machinery for 24 hours (because of the anesthesia used during the test).  NUTRITION  Drink plenty of fluids.   You may resume your normal diet as instructed by your doctor.   Begin with a light meal and progress to your normal diet. Heavy or fried foods are harder to digest and may make you feel sick to your stomach (nauseated).   Avoid alcoholic beverages for 24 hours or as instructed.  MEDICATIONS  You may resume your normal medications unless your doctor tells you otherwise.  WHAT YOU CAN EXPECT TODAY  Some feelings of bloating in the abdomen.   Passage of more gas than usual.   Spotting of blood in your stool or on the toilet paper.  IF YOU HAD POLYPS REMOVED DURING THE COLONOSCOPY:  No aspirin products for 7 days or as instructed.   No alcohol for 7 days or as instructed.   Eat a soft diet for the next 24 hours.  FINDING OUT THE RESULTS OF YOUR TEST Not all test results are available during your visit. If your test results are not back during the visit, make an appointment  with your caregiver to find out the results. Do not assume everything is normal if you have not heard from your caregiver or the medical facility. It is important for you to follow up on all of your test results.  SEEK IMMEDIATE MEDICAL ATTENTION IF:  You have more than a spotting of blood in your stool.   Your belly is swollen (abdominal distention).   You are nauseated or vomiting.   You have a temperature over 101.   You have abdominal pain or discomfort that is severe or gets worse throughout the day.    Colon polyp information provided  Further recommendations to follow pending review of pathology report     Colon Polyps Polyps are tissue growths inside the body. Polyps can grow in many places, including the large intestine (colon). A polyp may be a round bump or a mushroom-shaped growth. You could have one polyp or several. Most colon polyps are noncancerous (benign). However, some colon polyps can become cancerous over time. What are the causes? The exact cause of colon polyps is not known. What increases the risk? This condition is more likely to develop in people who:  Have a family history of colon cancer or colon polyps.  Are older than 52 or older than 45 if they are African American.  Have inflammatory bowel disease, such as ulcerative colitis or Crohn disease.  Are overweight.  Smoke cigarettes.  Do not get enough exercise.  Drink too  much alcohol.  Eat a diet that is: ? High in fat and red meat. ? Low in fiber.  Had childhood cancer that was treated with abdominal radiation.  What are the signs or symptoms? Most polyps do not cause symptoms. If you have symptoms, they may include:  Blood coming from your rectum when having a bowel movement.  Blood in your stool.The stool may look dark red or black.  A change in bowel habits, such as constipation or diarrhea.  How is this diagnosed? This condition is diagnosed with a colonoscopy. This is a  procedure that uses a lighted, flexible scope to look at the inside of your colon. How is this treated? Treatment for this condition involves removing any polyps that are found. Those polyps will then be tested for cancer. If cancer is found, your health care provider will talk to you about options for colon cancer treatment. Follow these instructions at home: Diet  Eat plenty of fiber, such as fruits, vegetables, and whole grains.  Eat foods that are high in calcium and vitamin D, such as milk, cheese, yogurt, eggs, liver, fish, and broccoli.  Limit foods high in fat, red meats, and processed meats, such as hot dogs, sausage, bacon, and lunch meats.  Maintain a healthy weight, or lose weight if recommended by your health care provider. General instructions  Do not smoke cigarettes.  Do not drink alcohol excessively.  Keep all follow-up visits as told by your health care provider. This is important. This includes keeping regularly scheduled colonoscopies. Talk to your health care provider about when you need a colonoscopy.  Exercise every day or as told by your health care provider. Contact a health care provider if:  You have new or worsening bleeding during a bowel movement.  You have new or increased blood in your stool.  You have a change in bowel habits.  You unexpectedly lose weight. This information is not intended to replace advice given to you by your health care provider. Make sure you discuss any questions you have with your health care provider. Document Released: 09/26/2003 Document Revised: 06/07/2015 Document Reviewed: 11/20/2014 Elsevier Interactive Patient Education  2018 [place], Care After These instructions provide you with information about caring for yourself after your procedure. Your health care provider may also give you more specific instructions. Your treatment has been planned according to current medical  practices, but problems sometimes occur. Call your health care provider if you have any problems or questions after your procedure. What can I expect after the procedure? After your procedure, it is common to:  Feel sleepy for several hours.  Feel clumsy and have poor balance for several hours.  Feel forgetful about what happened after the procedure.  Have poor judgment for several hours.  Feel nauseous or vomit.  Have a sore throat if you had a breathing tube during the procedure.  Follow these instructions at home: For at least 24 hours after the procedure:   Do not: ? Participate in activities in which you could fall or become injured. ? Drive. ? Use heavy machinery. ? Drink alcohol. ? Take sleeping pills or medicines that cause drowsiness. ? Make important decisions or sign legal documents. ? Take care of children on your own.  Rest. Eating and drinking  Follow the diet that is recommended by your health care provider.  If you vomit, drink water, juice, or soup when you can drink without vomiting.  Make sure  you have little or no nausea before eating solid foods. General instructions  Have a responsible adult stay with you until you are awake and alert.  Take over-the-counter and prescription medicines only as told by your health care provider.  If you smoke, do not smoke without supervision.  Keep all follow-up visits as told by your health care provider. This is important. Contact a health care provider if:  You keep feeling nauseous or you keep vomiting.  You feel light-headed.  You develop a rash.  You have a fever. Get help right away if:  You have trouble breathing. This information is not intended to replace advice given to you by your health care provider. Make sure you discuss any questions you have with your health care provider. Document Released: 04/22/2015 Document Revised: 08/22/2015 Document Reviewed: 04/22/2015 Elsevier Interactive  Patient Education  [employer].

## 2017-11-23 NOTE — Anesthesia Preprocedure Evaluation (Signed)
Anesthesia Evaluation    Airway Mallampati: II       Dental  (+) Upper Dentures, Lower Dentures   Pulmonary COPD,     + decreased breath sounds      Cardiovascular hypertension, On Medications +CHF   Rhythm:regular     Neuro/Psych PSYCHIATRIC DISORDERS Anxiety    GI/Hepatic   Endo/Other    Renal/GU      Musculoskeletal   Abdominal   Peds  Hematology   Anesthesia Other Findings Chronic ETOH abuse, pancreatitis hx, encephalopathy COPD with multiple exacerbations  Reproductive/Obstetrics                             Anesthesia Physical Anesthesia Plan  ASA: III  Anesthesia Plan: MAC   Post-op Pain Management:    Induction:   PONV Risk Score and Plan:   Airway Management Planned:   Additional Equipment:   Intra-op Plan:   Post-operative Plan:   Informed Consent:   Plan Discussed with: Anesthesiologist  Anesthesia Plan Comments:         Anesthesia Quick Evaluation

## 2017-11-23 NOTE — Anesthesia Postprocedure Evaluation (Signed)
Anesthesia Post Note  Patient: [name]  Procedure(s) Performed: COLONOSCOPY WITH PROPOFOL (N/A ) POLYPECTOMY  Patient location during evaluation: PACU Anesthesia Type: MAC Level of consciousness: awake and alert and oriented Pain management: pain level controlled Vital Signs Assessment: post-procedure vital signs reviewed and stable Respiratory status: spontaneous breathing Cardiovascular status: blood pressure returned to baseline Postop Assessment: no apparent nausea or vomiting and adequate PO intake Anesthetic complications: no     Last Vitals:  Vitals:   11/23/17 1415 11/23/17 1450  BP:  115/76  Pulse:  74  Resp:  18  Temp:  36.6 C  SpO2: 100% 100%    Last Pain:  Vitals:   11/23/17 1450  TempSrc:   PainSc: 0-No pain                 Harshaan Whang

## 2017-11-26 ENCOUNTER — Encounter: Payer: Self-pay | Admitting: Internal Medicine

## 2017-11-26 DIAGNOSIS — L723 Sebaceous cyst: Secondary | ICD-10-CM | POA: Diagnosis not present

## 2017-11-26 DIAGNOSIS — R1310 Dysphagia, unspecified: Secondary | ICD-10-CM | POA: Diagnosis not present

## 2017-11-26 DIAGNOSIS — L728 Other follicular cysts of the skin and subcutaneous tissue: Secondary | ICD-10-CM | POA: Diagnosis not present

## 2017-11-26 DIAGNOSIS — L089 Local infection of the skin and subcutaneous tissue, unspecified: Secondary | ICD-10-CM | POA: Diagnosis not present

## 2017-11-27 ENCOUNTER — Encounter (HOSPITAL_COMMUNITY): Payer: Self-pay | Admitting: Internal Medicine

## 2017-11-27 DIAGNOSIS — I10 Essential (primary) hypertension: Secondary | ICD-10-CM | POA: Diagnosis not present

## 2017-12-02 DIAGNOSIS — M25552 Pain in left hip: Secondary | ICD-10-CM | POA: Diagnosis not present

## 2017-12-02 DIAGNOSIS — M542 Cervicalgia: Secondary | ICD-10-CM | POA: Diagnosis not present

## 2017-12-02 DIAGNOSIS — G603 Idiopathic progressive neuropathy: Secondary | ICD-10-CM | POA: Diagnosis not present

## 2017-12-02 DIAGNOSIS — M545 Low back pain: Secondary | ICD-10-CM | POA: Diagnosis not present

## 2017-12-07 ENCOUNTER — Encounter: Payer: BLUE CROSS/BLUE SHIELD | Admitting: Thoracic Surgery (Cardiothoracic Vascular Surgery)

## 2017-12-30 ENCOUNTER — Encounter: Payer: BLUE CROSS/BLUE SHIELD | Admitting: Cardiothoracic Surgery

## 2018-01-12 DIAGNOSIS — M79603 Pain in arm, unspecified: Secondary | ICD-10-CM | POA: Diagnosis not present

## 2018-01-12 DIAGNOSIS — M25552 Pain in left hip: Secondary | ICD-10-CM | POA: Diagnosis not present

## 2018-01-12 DIAGNOSIS — R209 Unspecified disturbances of skin sensation: Secondary | ICD-10-CM | POA: Diagnosis not present

## 2018-01-12 DIAGNOSIS — G603 Idiopathic progressive neuropathy: Secondary | ICD-10-CM | POA: Diagnosis not present

## 2018-01-14 ENCOUNTER — Other Ambulatory Visit: Payer: Self-pay

## 2018-01-14 ENCOUNTER — Institutional Professional Consult (permissible substitution) (INDEPENDENT_AMBULATORY_CARE_PROVIDER_SITE_OTHER): Payer: BLUE CROSS/BLUE SHIELD | Admitting: Cardiothoracic Surgery

## 2018-01-14 ENCOUNTER — Encounter: Payer: Self-pay | Admitting: Cardiothoracic Surgery

## 2018-01-14 VITALS — BP 146/68 | HR 68 | Resp 16 | Ht 66.0 in | Wt 152.0 lb

## 2018-01-14 DIAGNOSIS — R918 Other nonspecific abnormal finding of lung field: Secondary | ICD-10-CM | POA: Diagnosis not present

## 2018-01-14 DIAGNOSIS — I712 Thoracic aortic aneurysm, without rupture, unspecified: Secondary | ICD-10-CM

## 2018-01-14 NOTE — Progress Notes (Signed)
PCP is Celene Squibb, MD Referring Provider is Celene Squibb, MD  Chief Complaint  Patient presents with  . Thoracic Aortic Aneurysm    new patient consultation, PFTs 10/28/17, Chest CT 10/17/17, ECHO 10/15/17  Patient examined and CT scan images performed October 2019 personally reviewed and counseled with patient. 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  This was noted when she presented with a left neck abscess and had a CT scan of the neck and upper chest.  Patient has chronic lung disease active smoking and COPD with diffusion capacity 51% of predicted and FEV1 1.7[59%] patient has had a 2D echocardiogram within the past year showing EF of 60% with mild aortic root dilatation, no significant AI or AS and probable 3 leaflet aortic valve  Patient takes losartan 25 mg a day No family history of thoracic or abdominal aortic aneurysm disease  Past Medical History:  Diagnosis Date  . Anxiety   . Aortic aneurysm ([place])   . Arthritis   . COPD (chronic obstructive pulmonary disease) ([place])   . ETOH abuse   . Hemorrhoids   . 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 Pancreatitis 2012    Past Surgical History:  Procedure Laterality Date  . CHOLECYSTECTOMY  90's  . COLONOSCOPY WITH PROPOFOL N/A 11/23/2017   Procedure: COLONOSCOPY WITH PROPOFOL;  Surgeon: Daneil Dolin, MD;  Location: [facility];  Service: Endoscopy;  Laterality: N/A;  1:45pm  . ERCP     in the past with Dr.Rourk for CBD stone?  Marland Kitchen POLYPECTOMY  11/23/2017   Procedure: POLYPECTOMY;  Surgeon: Daneil Dolin, MD;  Location: [facility];  Service: Endoscopy;;  colon   . TUBAL LIGATION      Family History  Problem Relation Age of Onset  . Colon cancer Sister 66    Social History Social History   Tobacco Use  . Smoking status: Current Every Day Smoker    Packs/day: 0.50    Types:  Cigarettes  . Smokeless tobacco: Never Used  . Tobacco comment: 2 a day per pt report  Substance Use Topics  . Alcohol use: Not Currently    Frequency: Never    Comment: occas  . Drug use: Not Currently    Types: Methamphetamines    Comment: on drug list HX    Current Outpatient Medications  Medication Sig Dispense Refill  . albuterol (PROVENTIL HFA;VENTOLIN HFA) 108 (90 Base) MCG/ACT inhaler Inhale 2 puffs into the lungs every 4 (four) hours as needed for wheezing or shortness of breath (cough, shortness of breath or wheezing.). 1 Inhaler 1  . aspirin-acetaminophen-caffeine (EXCEDRIN MIGRAINE) 250-250-65 MG tablet Take 1 tablet by mouth every 6 (six) hours as needed for headache.    . Cholecalciferol (VITAMIN D3) 50000 units CAPS Take 50,000 Units by mouth every Friday.  0  . DULoxetine (CYMBALTA) 60 MG capsule Take 60 mg by mouth daily.     . Fluticasone-Umeclidin-Vilant (TRELEGY ELLIPTA) 100-62.5-25 MCG/INH AEPB Inhale 1 puff into the lungs daily.    . furosemide (LASIX) 40 MG tablet Take 1 tablet (40 mg total) by mouth daily. 30 tablet 0  . HYDROcodone-acetaminophen (NORCO/VICODIN) 5-325 MG tablet Take 1 tablet by mouth 2 (two) times daily as needed for moderate pain.   0  . ipratropium-albuterol (DUONEB) 0.5-2.5 (3) MG/3ML SOLN Take 3 mLs by nebulization every 4 (four) hours as needed (wheezing, coughing, SOB).  360 mL 0  . losartan (COZAAR) 25 MG tablet Take 25 mg by mouth daily.  5  . nicotine (NICODERM CQ - DOSED IN MG/24 HOURS) 21 mg/24hr patch Place 21 mg onto the skin daily.    . potassium chloride SA (K-DUR,KLOR-CON) 20 MEQ tablet Take 2 tablets (40 mEq total) by mouth daily. 60 tablet 0  . pregabalin (LYRICA) 150 MG capsule Take 150 mg by mouth 2 (two) times daily.   0  . traMADol (ULTRAM) 50 MG tablet Take 50-100 mg by mouth every 6 (six) hours.   0   No current facility-administered medications for this visit.     Allergies  Allergen Reactions  . Lisinopril Hives and  Other (See Comments)    REACTION: Breaks out in whelts  . Propoxyphene N-Acetaminophen Itching   Review of systems  No significant thoracic injury or trauma or pneumothorax No history of hematemesis or hemoptysis Right-hand-dominant No history of TIA No active dental complaints No current abdominal pain Mild chronic ankle edema from Lyrica Lower extremity neuropathy, no stroke   BP (!) 146/68 (BP Location: Right Arm, Patient Position: Sitting, Cuff Size: Large)   Pulse 68   Resp 16   Ht 5\' 6"  (1.676 m)   Wt 152 lb (68.9 kg)   SpO2 93% Comment: RA  BMI 24.53 kg/m  Physical Exam      Exam    General- alert and comfortable    Neck- no JVD, no cervical adenopathy palpable, no carotid bruit   Lungs- clear without rales, wheezes   Cor- regular rate and rhythm, 2/6 systolic murmur of aortic sclerosis, no gallop   Abdomen- soft, non-tender without pulsatile mass   Extremities - warm, non-tender, minimal edema   Neuro- oriented, appropriate, no focal weakness   Diagnostic Tests: CTA images personally reviewed both of the neck CT and CT of the chest abdomen and pelvis. The ascending aorta is with smooth contour and mildly dilated in a fusiform pattern measuring 4.4 cm.  There is no mural thickening or penetrating ulcer.  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  Smoking cessation and blood pressure control are the best therapy  to prevent progression of disease.  Patient should continue her losartan and try to reduce and then stop smoking. Risk of aortic dissection in the current situation is less than 1%.  Surgery is usually not recommended until the diameter exceeds 5.5 cm. Plan: Patient will need surveillance scans and she will return in 6 months with a CTA of the thoracic aorta.   Len Childs, MD [facility] [phone]

## 2018-01-25 DIAGNOSIS — W010XXA Fall on same level from slipping, tripping and stumbling without subsequent striking against object, initial encounter: Secondary | ICD-10-CM | POA: Diagnosis not present

## 2018-01-25 DIAGNOSIS — J449 Chronic obstructive pulmonary disease, unspecified: Secondary | ICD-10-CM | POA: Diagnosis not present

## 2018-01-25 DIAGNOSIS — Y92002 Bathroom of unspecified non-institutional (private) residence single-family (private) house as the place of occurrence of the external cause: Secondary | ICD-10-CM | POA: Diagnosis not present

## 2018-01-25 DIAGNOSIS — S0532XA Ocular laceration without prolapse or loss of intraocular tissue, left eye, initial encounter: Secondary | ICD-10-CM | POA: Diagnosis not present

## 2018-01-25 DIAGNOSIS — R42 Dizziness and giddiness: Secondary | ICD-10-CM | POA: Diagnosis not present

## 2018-01-25 DIAGNOSIS — R51 Headache: Secondary | ICD-10-CM | POA: Diagnosis not present

## 2018-01-25 DIAGNOSIS — S0181XA Laceration without foreign body of other part of head, initial encounter: Secondary | ICD-10-CM | POA: Diagnosis not present

## 2018-01-25 DIAGNOSIS — F17200 Nicotine dependence, unspecified, uncomplicated: Secondary | ICD-10-CM | POA: Diagnosis not present

## 2018-01-25 DIAGNOSIS — I10 Essential (primary) hypertension: Secondary | ICD-10-CM | POA: Diagnosis not present

## 2018-01-25 DIAGNOSIS — S0990XA Unspecified injury of head, initial encounter: Secondary | ICD-10-CM | POA: Diagnosis not present

## 2018-01-25 DIAGNOSIS — H5712 Ocular pain, left eye: Secondary | ICD-10-CM | POA: Diagnosis not present

## 2018-01-25 DIAGNOSIS — Z79899 Other long term (current) drug therapy: Secondary | ICD-10-CM | POA: Diagnosis not present

## 2018-01-26 DIAGNOSIS — S0990XA Unspecified injury of head, initial encounter: Secondary | ICD-10-CM | POA: Diagnosis not present

## 2018-01-26 DIAGNOSIS — S0532XA Ocular laceration without prolapse or loss of intraocular tissue, left eye, initial encounter: Secondary | ICD-10-CM | POA: Diagnosis not present

## 2018-02-12 DIAGNOSIS — M25552 Pain in left hip: Secondary | ICD-10-CM | POA: Diagnosis not present

## 2018-02-12 DIAGNOSIS — G5623 Lesion of ulnar nerve, bilateral upper limbs: Secondary | ICD-10-CM | POA: Diagnosis not present

## 2018-02-12 DIAGNOSIS — R209 Unspecified disturbances of skin sensation: Secondary | ICD-10-CM | POA: Diagnosis not present

## 2018-02-12 DIAGNOSIS — G603 Idiopathic progressive neuropathy: Secondary | ICD-10-CM | POA: Diagnosis not present

## 2018-03-08 DIAGNOSIS — R911 Solitary pulmonary nodule: Secondary | ICD-10-CM | POA: Diagnosis not present

## 2018-03-08 DIAGNOSIS — R918 Other nonspecific abnormal finding of lung field: Secondary | ICD-10-CM | POA: Diagnosis not present

## 2018-03-08 DIAGNOSIS — I700 Atherosclerosis of aorta: Secondary | ICD-10-CM | POA: Diagnosis not present

## 2018-03-08 DIAGNOSIS — E279 Disorder of adrenal gland, unspecified: Secondary | ICD-10-CM | POA: Diagnosis not present

## 2018-03-08 DIAGNOSIS — J439 Emphysema, unspecified: Secondary | ICD-10-CM | POA: Diagnosis not present

## 2018-03-08 DIAGNOSIS — J9809 Other diseases of bronchus, not elsewhere classified: Secondary | ICD-10-CM | POA: Diagnosis not present

## 2018-03-29 DIAGNOSIS — E876 Hypokalemia: Secondary | ICD-10-CM | POA: Diagnosis not present

## 2018-03-29 DIAGNOSIS — Z6834 Body mass index (BMI) 34.0-34.9, adult: Secondary | ICD-10-CM | POA: Diagnosis not present

## 2018-03-29 DIAGNOSIS — E559 Vitamin D deficiency, unspecified: Secondary | ICD-10-CM | POA: Diagnosis not present

## 2018-03-29 DIAGNOSIS — I10 Essential (primary) hypertension: Secondary | ICD-10-CM | POA: Diagnosis not present

## 2018-04-06 DIAGNOSIS — F17210 Nicotine dependence, cigarettes, uncomplicated: Secondary | ICD-10-CM | POA: Diagnosis not present

## 2018-04-06 DIAGNOSIS — M79606 Pain in leg, unspecified: Secondary | ICD-10-CM | POA: Diagnosis not present

## 2018-04-06 DIAGNOSIS — M25552 Pain in left hip: Secondary | ICD-10-CM | POA: Diagnosis not present

## 2018-04-06 DIAGNOSIS — G5623 Lesion of ulnar nerve, bilateral upper limbs: Secondary | ICD-10-CM | POA: Diagnosis not present

## 2018-04-08 DIAGNOSIS — I5032 Chronic diastolic (congestive) heart failure: Secondary | ICD-10-CM | POA: Diagnosis not present

## 2018-04-08 DIAGNOSIS — J9612 Chronic respiratory failure with hypercapnia: Secondary | ICD-10-CM | POA: Diagnosis not present

## 2018-04-08 DIAGNOSIS — J441 Chronic obstructive pulmonary disease with (acute) exacerbation: Secondary | ICD-10-CM | POA: Diagnosis not present

## 2018-04-08 DIAGNOSIS — I10 Essential (primary) hypertension: Secondary | ICD-10-CM | POA: Diagnosis not present

## 2018-04-16 ENCOUNTER — Encounter (HOSPITAL_COMMUNITY): Payer: Self-pay

## 2018-04-16 ENCOUNTER — Other Ambulatory Visit: Payer: Self-pay

## 2018-04-16 ENCOUNTER — Emergency Department (HOSPITAL_COMMUNITY)
Admission: EM | Admit: 2018-04-16 | Discharge: 2018-04-16 | Disposition: A | Discharge status: Home / Self Care | Payer: BLUE CROSS/BLUE SHIELD | Attending: Emergency Medicine | Admitting: Emergency Medicine

## 2018-04-16 DIAGNOSIS — Z79899 Other long term (current) drug therapy: Secondary | ICD-10-CM | POA: Insufficient documentation

## 2018-04-16 DIAGNOSIS — J449 Chronic obstructive pulmonary disease, unspecified: Secondary | ICD-10-CM | POA: Insufficient documentation

## 2018-04-16 DIAGNOSIS — R2232 Localized swelling, mass and lump, left upper limb: Secondary | ICD-10-CM | POA: Diagnosis not present

## 2018-04-16 DIAGNOSIS — I5032 Chronic diastolic (congestive) heart failure: Secondary | ICD-10-CM | POA: Insufficient documentation

## 2018-04-16 DIAGNOSIS — L02412 Cutaneous abscess of left axilla: Secondary | ICD-10-CM | POA: Diagnosis not present

## 2018-04-16 DIAGNOSIS — I110 Hypertensive heart disease with heart failure: Secondary | ICD-10-CM | POA: Insufficient documentation

## 2018-04-16 DIAGNOSIS — F17210 Nicotine dependence, cigarettes, uncomplicated: Secondary | ICD-10-CM | POA: Insufficient documentation

## 2018-04-16 MED ORDER — LIDOCAINE-EPINEPHRINE (PF) 2 %-1:200000 IJ SOLN
10.0000 mL | Freq: Once | INTRAMUSCULAR | Status: AC
  Administered 2018-04-16: 10 mL
  Filled 2018-04-16: qty 10

## 2018-04-16 MED ORDER — CLINDAMYCIN HCL 150 MG PO CAPS: 150.0000 mg | ORAL_CAPSULE | Freq: Four times a day (QID) | ORAL | 0 refills | Status: DC

## 2018-04-16 MED ORDER — CLINDAMYCIN HCL 150 MG PO CAPS
300.0000 mg | ORAL_CAPSULE | Freq: Once | ORAL | Status: AC
  Administered 2018-04-16: 300 mg via ORAL
  Filled 2018-04-16: qty 2

## 2018-04-16 NOTE — ED Triage Notes (Signed)
Pt reports 3 knots under her left armpit worsening over the past 3 days, redness and pain, no drainage.

## 2018-04-16 NOTE — Discharge Instructions (Addendum)
Apply warm compresses several times a day.  Take ibuprofen or acetaminophen as needed for pain.

## 2018-04-16 NOTE — ED Provider Notes (Signed)
[facility] EMERGENCY DEPARTMENT Provider Note   CSN: [id number] Arrival date & time: 04/16/18  0356    History   Chief Complaint No chief complaint on file.   HPI [name] is a 58 y.o. female.  The history is provided by the patient.  She has history of COPD, pancreatitis, diastolic heart failure, thoracic aortic aneurysm and comes in complaining of painful swelling in her left axilla for the last 3 days.  This has gotten worse and she rates pain at 10/10.  It is worse if she palpates the area.  She cannot recall any trauma.  She denies fever, chills, sweats.  Past Medical History:  Diagnosis Date  . Anxiety   . Aortic aneurysm ([place])   . Arthritis   . COPD (chronic obstructive pulmonary disease) ([place])   . ETOH abuse   . Hemorrhoids   . 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 Pancreatitis 2012    Patient Active Problem List   Diagnosis Date Noted  . Encounter for screening colonoscopy 11/05/2017  . FH: colon cancer in first degree relative <42 years old 11/05/2017  . COPD (chronic obstructive pulmonary disease) ([place]) 05/25/2017  . Acute on chronic respiratory failure with hypercapnia ([place]) 05/25/2017  . Back pain 05/25/2017  . Acute metabolic encephalopathy 44/01/270  . Chronic diastolic CHF (congestive heart failure) ([place]) 05/25/2017  . Acute respiratory failure ([place]) 05/24/2017  . COPD with acute exacerbation ([place]) 05/09/2017  . Essential hypertension 05/09/2017  . Acute systolic CHF (congestive heart failure) ([place]) 05/09/2017  . Acute respiratory failure with hypoxia ([place]) 05/09/2017  . ALCOHOL ABUSE, HX OF 01/30/2010  . PANCREATITIS, HX OF 01/30/2010    Past Surgical History:  Procedure Laterality Date  . CHOLECYSTECTOMY  90's  . COLONOSCOPY WITH PROPOFOL N/A 11/23/2017   Procedure: COLONOSCOPY WITH PROPOFOL;  Surgeon: Daneil Dolin, MD;  [facility];  Service: Endoscopy;  Laterality: N/A;  1:45pm  . ERCP     in the past  with Dr.Rourk for CBD stone?  Marland Kitchen POLYPECTOMY  11/23/2017   Procedure: POLYPECTOMY;  Surgeon: Daneil Dolin, MD;  Location: [facility];  Service: Endoscopy;;  colon   . TUBAL LIGATION       OB History    Gravida  4   Para  3   Term  3   Preterm      AB  1   Living        SAB  1   TAB      Ectopic      Multiple      Live Births               Home Medications    Prior to Admission medications   Medication Sig Start Date End Date Taking? Authorizing Provider  albuterol (PROVENTIL HFA;VENTOLIN HFA) 108 (90 Base) MCG/ACT inhaler Inhale 2 puffs into the lungs every 4 (four) hours as needed for wheezing or shortness of breath (cough, shortness of breath or wheezing.). 05/12/17   Murlean Iba, MD  aspirin-acetaminophen-caffeine (EXCEDRIN MIGRAINE) [phone] MG tablet Take 1 tablet by mouth every 6 (six) hours as needed for headache.    [provider]  Cholecalciferol (VITAMIN D3) 50000 units CAPS Take 50,000 Units by mouth every Friday. 10/01/17   [provider]  DULoxetine (CYMBALTA) 60 MG capsule Take 60 mg by mouth daily.  09/29/17   [provider]  Fluticasone-Umeclidin-Vilant (TRELEGY ELLIPTA) 100-62.5-25 MCG/INH AEPB  Inhale 1 puff into the lungs daily.    [provider]  furosemide (LASIX) 40 MG tablet Take 1 tablet (40 mg total) by mouth daily. 05/12/17 01/14/18  Murlean Iba, MD  HYDROcodone-acetaminophen (NORCO/VICODIN) 5-325 MG tablet Take 1 tablet by mouth 2 (two) times daily as needed for moderate pain.  10/29/17   [provider]  ipratropium-albuterol (DUONEB) 0.5-2.5 (3) MG/3ML SOLN Take 3 mLs by nebulization every 4 (four) hours as needed (wheezing, coughing, SOB). 05/12/17   Johnson, Clanford L, MD  losartan (COZAAR) 25 MG tablet Take 25 mg by mouth daily. 10/29/17   [provider]  nicotine (NICODERM CQ - DOSED IN MG/24 HOURS) 21 mg/24hr patch Place 21 mg onto the skin daily.    [provider]  potassium chloride SA (K-DUR,KLOR-CON) 20 MEQ tablet Take 2 tablets (40 mEq total) by mouth daily. 05/12/17 01/14/18  Johnson, Clanford L, MD  pregabalin (LYRICA) 150 MG capsule Take 150 mg by mouth 2 (two) times daily.  09/08/17   [provider]  traMADol (ULTRAM) 50 MG tablet Take 50-100 mg by mouth every 6 (six) hours.  11/03/17   [provider]    Family History Family History  Problem Relation Age of Onset  . Colon cancer Sister 86    Social History Social History   Tobacco Use  . Smoking status: Current Every Day Smoker    Packs/day: 0.50    Types: Cigarettes  . Smokeless tobacco: Never Used  . Tobacco comment: 2 a day per pt report  Substance Use Topics  . Alcohol use: Not Currently    Frequency: Never    Comment: occas  . Drug use: Not Currently    Types: Methamphetamines    Comment: on drug list HX     Allergies   Lisinopril and Propoxyphene n-acetaminophen   Review of Systems Review of Systems  All other systems reviewed and are negative.    Physical Exam Updated Vital Signs BP 118/82 (BP Location: Right Arm)   Pulse 77   Temp 98.4 F (36.9 C) (Oral)   Resp 18   SpO2 99%   Physical Exam Vitals signs and nursing note reviewed.    58 year old female, resting comfortably and in no acute distress. Vital signs are normal. Oxygen saturation is 99%, which is normal. Head is normocephalic and atraumatic. PERRLA, EOMI. Oropharynx is clear. Neck is nontender and supple without adenopathy or JVD. Back is nontender and there is no CVA tenderness. Lungs are clear without rales, wheezes, or rhonchi. Chest is nontender. Heart has regular rate and rhythm without murmur. Abdomen is soft, flat, nontender without masses or hepatosplenomegaly and peristalsis is normoactive. Extremities have no cyanosis or edema, full range of motion is present.  Tender area in the left axilla with another area with a subcutaneous nodule in the left  axilla. Skin is warm and dry without rash. Neurologic: Mental status is normal, cranial nerves are intact, there are no motor or sensory deficits.  ED Treatments / Results   Procedures Ultrasound ED Soft Tissue Date/Time: 04/16/2018 4:22 AM Performed by: Delora Fuel, MD Authorized by: Delora Fuel, MD   Procedure details:    Indications: localization of abscess     Transverse view:  Visualized   Longitudinal view:  Visualized   Images: archived   Location:    Location: upper extremity     Location comment:  Axilla   Side:  Left Findings:     abscess present .Marland KitchenIncision  and Drainage Date/Time: 04/16/2018 4:23 AM Performed by: Delora Fuel, MD Authorized by: Delora Fuel, MD   Consent:    Consent obtained:  Verbal   Consent given by:  Patient   Risks discussed:  Bleeding, incomplete drainage and pain   Alternatives discussed:  Alternative treatment Location:    Type:  Abscess   Size:  2 cm   Location:  Upper extremity   Upper extremity location:  Arm (axilla)   Arm location:  L upper arm Pre-procedure details:    Skin preparation:  Antiseptic wash Anesthesia (see MAR for exact dosages):    Anesthesia method:  Local infiltration   Local anesthetic:  Lidocaine 2% WITH epi Procedure type:    Complexity:  Complex Procedure details:    Needle aspiration: no     Incision types:  Cruciate   Incision depth:  Subcutaneous   Scalpel blade:  11   Wound management:  Probed and deloculated   Drainage:  Purulent   Drainage amount:  Scant   Wound treatment:  Wound left open   Packing materials:  None Post-procedure details:    Patient tolerance of procedure:  Tolerated well, no immediate complications Comments:     Two separate abscesses incised and drained in left axilla     Medications Ordered in ED Medications  clindamycin (CLEOCIN) capsule 300 mg (has no administration in time range)  lidocaine-EPINEPHrine (XYLOCAINE W/EPI) 2 %-1:200000 (PF) injection 10 mL (10 mLs  Infiltration Given by Other 04/16/18 3329)     Initial Impression / Assessment and Plan / ED Course  I have reviewed the triage vital signs and the nursing notes.  Pain and swelling in the left axilla.  Limited bedside ultrasound was done confirming presence of an abscess.  This is treated with incision and drainage.  She is discharged with a prescription for clindamycin.  Final Clinical Impressions(s) / ED Diagnoses   Final diagnoses:  Abscess of left axilla    ED Discharge Orders         Ordered    clindamycin (CLEOCIN) 150 MG capsule  Every 6 hours     04/16/18 5188           Delora Fuel, MD 41/66/06 [phone]

## 2018-04-19 DIAGNOSIS — L089 Local infection of the skin and subcutaneous tissue, unspecified: Secondary | ICD-10-CM | POA: Diagnosis not present

## 2018-04-19 DIAGNOSIS — I10 Essential (primary) hypertension: Secondary | ICD-10-CM | POA: Diagnosis not present

## 2018-04-19 DIAGNOSIS — M25532 Pain in left wrist: Secondary | ICD-10-CM | POA: Diagnosis not present

## 2018-04-27 DIAGNOSIS — M1812 Unilateral primary osteoarthritis of first carpometacarpal joint, left hand: Secondary | ICD-10-CM | POA: Diagnosis not present

## 2018-04-27 DIAGNOSIS — M79645 Pain in left finger(s): Secondary | ICD-10-CM | POA: Diagnosis not present

## 2018-05-03 DIAGNOSIS — J441 Chronic obstructive pulmonary disease with (acute) exacerbation: Secondary | ICD-10-CM | POA: Diagnosis not present

## 2018-05-20 ENCOUNTER — Other Ambulatory Visit: Payer: Self-pay | Admitting: *Deleted

## 2018-05-20 DIAGNOSIS — I712 Thoracic aortic aneurysm, without rupture, unspecified: Secondary | ICD-10-CM

## 2018-05-21 ENCOUNTER — Ambulatory Visit (INDEPENDENT_AMBULATORY_CARE_PROVIDER_SITE_OTHER): Payer: BLUE CROSS/BLUE SHIELD | Admitting: Orthopedic Surgery

## 2018-05-21 ENCOUNTER — Encounter: Payer: Self-pay | Admitting: Orthopedic Surgery

## 2018-05-21 ENCOUNTER — Other Ambulatory Visit: Payer: Self-pay

## 2018-05-21 VITALS — BP 127/74 | HR 65 | Temp 97.1°F | Ht 66.0 in | Wt 148.0 lb

## 2018-05-21 DIAGNOSIS — G5622 Lesion of ulnar nerve, left upper limb: Secondary | ICD-10-CM | POA: Diagnosis not present

## 2018-05-21 DIAGNOSIS — G5621 Lesion of ulnar nerve, right upper limb: Secondary | ICD-10-CM | POA: Diagnosis not present

## 2018-05-21 DIAGNOSIS — G5601 Carpal tunnel syndrome, right upper limb: Secondary | ICD-10-CM

## 2018-05-21 DIAGNOSIS — G5602 Carpal tunnel syndrome, left upper limb: Secondary | ICD-10-CM

## 2018-05-21 NOTE — Progress Notes (Signed)
NEW PROBLEM OFFICE VISIT  Chief Complaint  Patient presents with  . 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 Merlene Laughter  She says she has had symptoms for 20 years no prior treatment  Went to the doctor because of increased pain numbness night pain weakness and increasing severity on the right side moderate minor symptoms on the left seems to be exacerbated at nighttime and with activities in the yard  She is already seeing Dr. Jeannie Fend at [facility] but call the office and could not get an appointment and therefore was given a referral here for evaluation and treatment.  I will put a picture of her nerve study report in the media section of the record      Review of Systems  Constitutional: Negative for chills and fever.  Musculoskeletal: Positive for joint pain.  Neurological: Positive for tingling, sensory change and focal weakness.     Past Medical History:  Diagnosis Date  . Anxiety   . Aortic aneurysm ([place])   . Arthritis   . COPD (chronic obstructive pulmonary disease) ([place])   . ETOH abuse   . Hemorrhoids   . 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 Pancreatitis 2012    Past Surgical History:  Procedure Laterality Date  . CHOLECYSTECTOMY  90's  . COLONOSCOPY WITH PROPOFOL N/A 11/23/2017   Procedure: COLONOSCOPY WITH PROPOFOL;  Surgeon: Daneil Dolin, MD;  [facility];  Service: Endoscopy;  Laterality: N/A;  1:45pm  . ERCP     in the past with Dr.Rourk for CBD stone?  Marland Kitchen POLYPECTOMY  11/23/2017   Procedure: POLYPECTOMY;  Surgeon: Daneil Dolin, MD;  [facility];  Service: Endoscopy;;  colon   . TUBAL LIGATION      Family History  Problem Relation Age of Onset  . Colon cancer Sister 86   Social History   Tobacco Use  . Smoking  status: Current Every Day Smoker    Packs/day: 0.50    Types: Cigarettes  . Smokeless tobacco: Never Used  . Tobacco comment: 2 a day per pt report  Substance Use Topics  . Alcohol use: Not Currently    Frequency: Never    Comment: occas  . Drug use: Not Currently    Types: Methamphetamines    Comment: on drug list HX    Allergies  Allergen Reactions  . Lisinopril Hives and Other (See Comments)    REACTION: Breaks out in whelts  . Propoxyphene N-Acetaminophen Itching    Current Meds  Medication Sig  . albuterol (PROVENTIL HFA;VENTOLIN HFA) 108 (90 Base) MCG/ACT inhaler Inhale 2 puffs into the lungs every 4 (four) hours as needed for wheezing or shortness of breath (cough, shortness of breath or wheezing.).  Marland Kitchen aspirin-acetaminophen-caffeine (EXCEDRIN MIGRAINE) 250-250-65 MG tablet Take 1 tablet by mouth every 6 (six) hours as needed for headache.  . clindamycin (CLEOCIN) 150 MG capsule Take 1 capsule (150 mg total) by mouth every 6 (six) hours.  . DULoxetine (CYMBALTA) 60 MG capsule Take 60 mg by mouth daily.   . Fluticasone-Umeclidin-Vilant (TRELEGY ELLIPTA) 100-62.5-25 MCG/INH AEPB Inhale 1 puff into the lungs daily.  Marland Kitchen HYDROcodone-acetaminophen (NORCO/VICODIN) 5-325 MG tablet Take 1 tablet by mouth 2 (two) times daily as needed for moderate pain.   Marland Kitchen  ipratropium-albuterol (DUONEB) 0.5-2.5 (3) MG/3ML SOLN Take 3 mLs by nebulization every 4 (four) hours as needed (wheezing, coughing, SOB).  Marland Kitchen losartan (COZAAR) 25 MG tablet Take 25 mg by mouth daily.  . nicotine (NICODERM CQ - DOSED IN MG/24 HOURS) 21 mg/24hr patch Place 21 mg onto the skin daily.  . pregabalin (LYRICA) 150 MG capsule Take 150 mg by mouth 2 (two) times daily.   . traMADol (ULTRAM) 50 MG tablet Take 50-100 mg by mouth every 6 (six) hours.     BP 127/74   Pulse 65   Temp (!) 97.1 F (36.2 C)   Ht 5\' 6"  (1.676 m)   Wt 148 lb (67.1 kg)   BMI 23.89 kg/m   Physical Exam Vitals signs and nursing note reviewed.   Constitutional:      Appearance: Normal appearance.  Neurological:     Mental Status: She is alert and oriented to person, place, and time.  Psychiatric:        Mood and Affect: Mood normal.     Ortho Exam  I will start with the right hand which is the more symptomatic.  No palpable tenderness in the hand or arm full range of motion in the hand wrist and elbow wrist and elbow stable grip strength normal and equal to the opposite side skin multiple areas of discoloration including subcutaneous ecchymosis.  Thin skin noted.  Pulse temperature color normal no swelling.  Epitrochlear lymph nodes are normal.  Thumb index long and ring finger decreased sensation.  Left hand much less symptomatic alignment is normal full range of motion no tenderness to palpation wrist and elbow are stable muscle strength and tone are normal grip strength is equal to the right the skin again shows thin skin with multiple areas of ecchymosis pulse temperature normal sensory exam shows decreased sensation at the fingertips  Tenderness CMC joint left thumb  [facility] sent nerve test report electrodiagnostic evidence of right ulnar neuropathy at the elbow consistent with cubital tunnel syndrome.  Nonlocalizing left ulnar sensory neuropathy.  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  Marland Kitchen Ulnar neuropathy at elbow of right upper extremity Yes  . Carpal tunnel syndrome on right   . Carpal tunnel syndrome on left   . 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  I am sending her for referral to the hand surgery was taking care of her  left thumb.  This is not a routine straightforward carpal tunnel release and I tend not to perform cubital tunnel releases.    No orders of the defined types were placed in this encounter.   Arther Abbott, MD  05/21/2018 11:09 AM

## 2018-05-21 NOTE — Patient Instructions (Signed)
Steps to Quit Smoking    Smoking tobacco can be bad for your health. It can also affect almost every organ in your body. Smoking puts you and people around you at risk for many serious long-lasting (chronic) diseases. Quitting smoking is hard, but it is one of the best things that you can do for your health. It is never too late to quit.  What are the benefits of quitting smoking?  When you quit smoking, you lower your risk for getting serious diseases and conditions. They can include:  · Lung cancer or lung disease.  · Heart disease.  · Stroke.  · Heart attack.  · Not being able to have children (infertility).  · Weak bones (osteoporosis) and broken bones (fractures).  If you have coughing, wheezing, and shortness of breath, those symptoms may get better when you quit. You may also get sick less often. If you are pregnant, quitting smoking can help to lower your chances of having a baby of low birth weight.  What can I do to help me quit smoking?  Talk with your doctor about what can help you quit smoking. Some things you can do (strategies) include:  · Quitting smoking totally, instead of slowly cutting back how much you smoke over a period of time.  · Going to in-person counseling. You are more likely to quit if you go to many counseling sessions.  · Using resources and support systems, such as:  ? Online chats with a counselor.  ? Phone quitlines.  ? Printed self-help materials.  ? Support groups or group counseling.  ? Text messaging programs.  ? Mobile phone apps or applications.  · Taking medicines. Some of these medicines may have nicotine in them. If you are pregnant or breastfeeding, do not take any medicines to quit smoking unless your doctor says it is okay. Talk with your doctor about counseling or other things that can help you.  Talk with your doctor about using more than one strategy at the same time, such as taking medicines while you are also going to in-person counseling. This can help make  quitting easier.  What things can I do to make it easier to quit?  Quitting smoking might feel very hard at first, but there is a lot that you can do to make it easier. Take these steps:  · Talk to your family and friends. Ask them to support and encourage you.  · Call phone quitlines, reach out to support groups, or work with a counselor.  · Ask people who smoke to not smoke around you.  · Avoid places that make you want (trigger) to smoke, such as:  ? Bars.  ? Parties.  ? Smoke-break areas at work.  · Spend time with people who do not smoke.  · Lower the stress in your life. Stress can make you want to smoke. Try these things to help your stress:  ? Getting regular exercise.  ? Deep-breathing exercises.  ? Yoga.  ? Meditating.  ? Doing a body scan. To do this, close your eyes, focus on one area of your body at a time from head to toe, and notice which parts of your body are tense. Try to relax the muscles in those areas.  · Download or buy apps on your mobile phone or tablet that can help you stick to your quit plan. There are many free apps, such as QuitGuide from the CDC (Centers for Disease Control and Prevention). You can find more   support from smokefree.gov and other websites.  This information is not intended to replace advice given to you by your health care provider. Make sure you discuss any questions you have with your health care provider.  Document Released: 10/26/2008 Document Revised: 08/28/2015 Document Reviewed: 05/16/2014  [employer]vier Interactive Patient Education © 2019 [employer]

## 2018-05-28 DIAGNOSIS — G5621 Lesion of ulnar nerve, right upper limb: Secondary | ICD-10-CM | POA: Diagnosis not present

## 2018-05-28 DIAGNOSIS — G5601 Carpal tunnel syndrome, right upper limb: Secondary | ICD-10-CM | POA: Diagnosis not present

## 2018-05-28 DIAGNOSIS — M79645 Pain in left finger(s): Secondary | ICD-10-CM | POA: Diagnosis not present

## 2018-06-29 DIAGNOSIS — G5601 Carpal tunnel syndrome, right upper limb: Secondary | ICD-10-CM | POA: Diagnosis not present

## 2018-06-29 DIAGNOSIS — M79606 Pain in leg, unspecified: Secondary | ICD-10-CM | POA: Diagnosis not present

## 2018-06-29 DIAGNOSIS — F17210 Nicotine dependence, cigarettes, uncomplicated: Secondary | ICD-10-CM | POA: Diagnosis not present

## 2018-06-29 DIAGNOSIS — G5623 Lesion of ulnar nerve, bilateral upper limbs: Secondary | ICD-10-CM | POA: Diagnosis not present

## 2018-07-05 DIAGNOSIS — G5601 Carpal tunnel syndrome, right upper limb: Secondary | ICD-10-CM | POA: Diagnosis not present

## 2018-07-05 DIAGNOSIS — G5621 Lesion of ulnar nerve, right upper limb: Secondary | ICD-10-CM | POA: Diagnosis not present

## 2018-07-14 DIAGNOSIS — M13842 Other specified arthritis, left hand: Secondary | ICD-10-CM | POA: Diagnosis not present

## 2018-07-14 DIAGNOSIS — M79641 Pain in right hand: Secondary | ICD-10-CM | POA: Diagnosis not present

## 2018-07-15 ENCOUNTER — Other Ambulatory Visit: Payer: Self-pay

## 2018-07-15 ENCOUNTER — Emergency Department (HOSPITAL_COMMUNITY)
Admission: EM | Admit: 2018-07-15 | Discharge: 2018-07-15 | Disposition: A | Discharge status: Home / Self Care | Payer: BC Managed Care – PPO | Attending: Emergency Medicine | Admitting: Emergency Medicine

## 2018-07-15 DIAGNOSIS — I5032 Chronic diastolic (congestive) heart failure: Secondary | ICD-10-CM | POA: Diagnosis not present

## 2018-07-15 DIAGNOSIS — M545 Low back pain, unspecified: Secondary | ICD-10-CM

## 2018-07-15 DIAGNOSIS — Z79899 Other long term (current) drug therapy: Secondary | ICD-10-CM | POA: Diagnosis not present

## 2018-07-15 DIAGNOSIS — J449 Chronic obstructive pulmonary disease, unspecified: Secondary | ICD-10-CM | POA: Insufficient documentation

## 2018-07-15 DIAGNOSIS — F17210 Nicotine dependence, cigarettes, uncomplicated: Secondary | ICD-10-CM | POA: Insufficient documentation

## 2018-07-15 DIAGNOSIS — I110 Hypertensive heart disease with heart failure: Secondary | ICD-10-CM | POA: Diagnosis not present

## 2018-07-15 DIAGNOSIS — M5489 Other dorsalgia: Secondary | ICD-10-CM | POA: Diagnosis not present

## 2018-07-15 MED ORDER — OXYCODONE-ACETAMINOPHEN 5-325 MG PO TABS
1.0000 | ORAL_TABLET | Freq: Once | ORAL | Status: AC
  Administered 2018-07-15: 22:00:00 1 via ORAL
  Filled 2018-07-15: qty 1

## 2018-07-15 MED ORDER — DIAZEPAM 10 MG PO TABS: 10.0000 mg | ORAL_TABLET | Freq: Four times a day (QID) | ORAL | 0 refills | Status: AC | PRN

## 2018-07-15 MED ORDER — PREDNISONE 20 MG PO TABS: 20.0000 mg | ORAL_TABLET | Freq: Two times a day (BID) | ORAL | 0 refills | Status: AC

## 2018-07-15 MED ORDER — PREDNISONE 50 MG PO TABS
60.0000 mg | ORAL_TABLET | Freq: Once | ORAL | Status: AC
  Administered 2018-07-15: 60 mg via ORAL
  Filled 2018-07-15: qty 1

## 2018-07-15 MED ORDER — LORAZEPAM 1 MG PO TABS
1.0000 mg | ORAL_TABLET | Freq: Once | ORAL | Status: AC
  Administered 2018-07-15: 1 mg via ORAL
  Filled 2018-07-15: qty 1

## 2018-07-15 NOTE — ED Provider Notes (Signed)
[facility] EMERGENCY DEPARTMENT Provider Note   CSN: [id number] Arrival date & time: 07/15/18  2020    History   Chief Complaint Chief Complaint  Patient presents with  . Back Pain    HPI [name] is a 58 y.o. female.     HPI   She presents for evaluation of low back pain.  The pain started today, without trauma.  She states that she is recovering from carpal tunnel and ulnar release surgery, about a month ago.  Therefore, she has not done any heavy lifting, or activity.  She denies radiation of pain to upper back, or legs.  The pain is primarily right lumbar region.  There is been no change in her bowel or bladder function.  He denies fever, cough, chills or dizziness.  There are no other known modifying factors.  Past Medical History:  Diagnosis Date  . Anxiety   . Aortic aneurysm ([place])   . Arthritis   . COPD (chronic obstructive pulmonary disease) ([place])   . ETOH abuse   . Hemorrhoids   . 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 Pancreatitis 2012    Patient Active Problem List   Diagnosis Date Noted  . Encounter for screening colonoscopy 11/05/2017  . FH: colon cancer in first degree relative <60 years old 11/05/2017  . COPD (chronic obstructive pulmonary disease) ([place]) 05/25/2017  . Acute on chronic respiratory failure with hypercapnia ([place]) 05/25/2017  . Back pain 05/25/2017  . Acute metabolic encephalopathy 59/[id number]/5701  . Chronic diastolic CHF (congestive heart failure) ([place]) 05/25/2017  . Acute respiratory failure ([place]) 05/24/2017  . COPD with acute exacerbation ([place]) 05/09/2017  . Essential hypertension 05/09/2017  . Acute systolic CHF (congestive heart failure) ([place]) 05/09/2017  . Acute respiratory failure with hypoxia ([place]) 05/09/2017  . ALCOHOL ABUSE, HX OF 01/30/2010  . PANCREATITIS, HX OF 01/30/2010    Past Surgical History:  Procedure Laterality Date  . CHOLECYSTECTOMY  90's  . COLONOSCOPY WITH PROPOFOL N/A 11/23/2017   Procedure: COLONOSCOPY WITH PROPOFOL;  Surgeon: Daneil Dolin, MD;  Location: [facility] ENDO SUITE;  Service: Endoscopy;  Laterality: N/A;  1:45pm  . ERCP     in the past with Dr.Rourk for CBD stone?  Marland Kitchen POLYPECTOMY  11/23/2017   Procedure: POLYPECTOMY;  Surgeon: Daneil Dolin, MD;  Location: [facility] ENDO SUITE;  Service: Endoscopy;;  colon   . TUBAL LIGATION       OB History    Gravida  4   Para  3   Term  3   Preterm      AB  1   Living        SAB  1   TAB      Ectopic      Multiple      Live Births               Home Medications    Prior to Admission medications   Medication Sig Start Date End Date Taking? Authorizing Provider  albuterol (PROVENTIL HFA;VENTOLIN HFA) 108 (90 Base) MCG/ACT inhaler Inhale 2 puffs into the lungs every 4 (four) hours as needed for wheezing or shortness of breath (cough, shortness of breath or wheezing.). 05/12/17  Yes Johnson, Clanford L, MD  aspirin-acetaminophen-caffeine (EXCEDRIN MIGRAINE) [phone] MG tablet Take 2 tablets by mouth daily as needed for headache.    Yes [provider]  DULoxetine (CYMBALTA) 60 MG capsule Take 60 mg by mouth daily.  09/29/17  Yes [provider]  Fluticasone-Umeclidin-Vilant (TRELEGY ELLIPTA) 100-62.5-25 MCG/INH AEPB Inhale 1 puff into the lungs daily.   Yes [provider]  furosemide (LASIX) 40 MG tablet Take 1 tablet (40 mg total) by mouth daily. 05/12/17 07/15/18 Yes Johnson, Clanford L, MD  HYDROcodone-acetaminophen (NORCO) 7.5-325 MG tablet Take 1 tablet by mouth 2 (two) times a day.  10/29/17  Yes [provider]  ipratropium-albuterol (DUONEB) 0.5-2.5 (3) MG/3ML SOLN Take 3 mLs by nebulization every 4 (four) hours as needed (wheezing, coughing, SOB). 05/12/17  Yes Johnson, Clanford L, MD  losartan (COZAAR) 50 MG tablet Take 50 mg by mouth daily.  10/29/17  Yes [provider]  potassium chloride SA (K-DUR,KLOR-CON) 20 MEQ tablet Take 2 tablets (40 mEq total) by  mouth daily. 05/12/17 07/15/18 Yes Johnson, Clanford L, MD  pregabalin (LYRICA) 200 MG capsule Take 200 mg by mouth 2 (two) times daily. 05/17/18  Yes [provider]  traMADol (ULTRAM) 50 MG tablet Take 50-100 mg by mouth every 6 (six) hours.  11/03/17  Yes [provider]  diazepam (VALIUM) 10 MG tablet Take 1 tablet (10 mg total) by mouth every 6 (six) hours as needed (Muscle spasm). 07/15/18   Daleen Bo, MD  predniSONE (DELTASONE) 20 MG tablet Take 1 tablet (20 mg total) by mouth 2 (two) times daily. 07/15/18   Daleen Bo, MD    Family History Family History  Problem Relation Age of Onset  . Colon cancer Sister 15    Social History Social History   Tobacco Use  . Smoking status: Current Every Day Smoker    Packs/day: 0.50    Types: Cigarettes  . Smokeless tobacco: Never Used  . Tobacco comment: 2 a day per pt report  Substance Use Topics  . Alcohol use: Not Currently    Frequency: Never    Comment: occas  . Drug use: Not Currently    Types: Methamphetamines    Comment: on drug list HX     Allergies   Lisinopril and Propoxyphene n-acetaminophen   Review of Systems Review of Systems  All other systems reviewed and are negative.    Physical Exam Updated Vital Signs BP (!) 189/79 (BP Location: Right Arm)   Pulse 62   Temp 98.1 F (36.7 C) (Oral)   Ht 5\' 6"  (1.676 m)   Wt 69.9 kg   SpO2 99%   BMI 24.86 kg/m   Physical Exam Vitals signs and nursing note reviewed.  Constitutional:      Appearance: She is well-developed.  HENT:     Head: Normocephalic and atraumatic.  Eyes:     Conjunctiva/sclera: Conjunctivae normal.     Pupils: Pupils are equal, round, and reactive to light.  Neck:     Musculoskeletal: Normal range of motion and neck supple.     Trachea: Phonation normal.  Cardiovascular:     Rate and Rhythm: Normal rate and regular rhythm.  Pulmonary:     Effort: Pulmonary effort is normal.     Breath sounds: Normal breath sounds.   Chest:     Chest wall: No tenderness.  Abdominal:     General: There is no distension.     Palpations: Abdomen is soft.     Tenderness: There is no abdominal tenderness. There is no guarding.  Musculoskeletal:     Comments: Guards against movement of the right lower back secondary to pain.  Right lower lumbar region is mildly tender to palpation.  No deformity or step-off of  the thoracic or lumbar spine.  Negative straight leg raising bilaterally.  Skin:    General: Skin is warm and dry.  Neurological:     Mental Status: She is alert and oriented to person, place, and time.     Motor: No abnormal muscle tone.  Psychiatric:        Mood and Affect: Mood normal.        Behavior: Behavior normal.        Thought Content: Thought content normal.        Judgment: Judgment normal.      ED Treatments / Results  Labs (all labs ordered are listed, but only abnormal results are displayed) Labs Reviewed - No data to display  EKG None  Radiology No results found.  Procedures Procedures (including critical care time)  Medications Ordered in ED Medications  oxyCODONE-acetaminophen (PERCOCET/ROXICET) 5-325 MG per tablet 1 tablet (1 tablet Oral Given 07/15/18 2221)  LORazepam (ATIVAN) tablet 1 mg (1 mg Oral Given 07/15/18 2221)  predniSONE (DELTASONE) tablet 60 mg (60 mg Oral Given 07/15/18 2221)     Initial Impression / Assessment and Plan / ED Course  I have reviewed the triage vital signs and the nursing notes.  Pertinent labs & imaging results that were available during my care of the patient were reviewed by me and considered in my medical decision making (see chart for details).         Patient Vitals for the past 24 hrs:  BP Temp Temp src Pulse SpO2 Height Weight  07/15/18 2107 (!) 189/79 98.1 F (36.7 C) Oral 62 99 % - -  07/15/18 2106 - - - - - 5\' 6"  (1.676 m) 69.9 kg    11:06 PM Reevaluation with update and discussion. After initial assessment and treatment, an  updated evaluation reveals no change in clinical status, findings discussed with the patient and all questions were answered. Daleen Bo   Medical Decision Making: Musculoskeletal low back pain, likely related to arthritis.  Doubt lumbar myelopathy, fracture or bony tumor.  There is no indication for further ED evaluation or hospitalization, at this time.  CRITICAL CARE-no Performed by: Daleen Bo  Nursing Notes Reviewed/ Care Coordinated Applicable Imaging Reviewed Interpretation of Laboratory Data incorporated into ED treatment  The patient appears reasonably screened and/or stabilized for discharge and I doubt any other medical condition or other [facility] requiring further screening, evaluation, or treatment in the ED at this time prior to discharge.  Plan: Home Medications-continue usual medications; Home Treatments-heat and cryotherapy; return here if the recommended treatment, does not improve the symptoms; Recommended follow up-PCP,.   Final Clinical Impressions(s) / ED Diagnoses   Final diagnoses:  Acute right-sided low back pain without sciatica    ED Discharge Orders         Ordered    predniSONE (DELTASONE) 20 MG tablet  2 times daily     07/15/18 2305    diazepam (VALIUM) 10 MG tablet  Every 6 hours PRN     07/15/18 2305           Daleen Bo, MD 07/15/18 2306

## 2018-07-15 NOTE — Discharge Instructions (Signed)
Use ice on the sore area 3 or 4 times a day for 2 days after that use heat.  Use the prescription sent to your pharmacy.  Return here if needed, for problems.

## 2018-07-15 NOTE — ED Triage Notes (Signed)
Pt woke up with lower back pain. Denies any injury

## 2018-07-18 DIAGNOSIS — T8131XA Disruption of external operation (surgical) wound, not elsewhere classified, initial encounter: Secondary | ICD-10-CM | POA: Diagnosis not present

## 2018-07-18 DIAGNOSIS — F17200 Nicotine dependence, unspecified, uncomplicated: Secondary | ICD-10-CM | POA: Diagnosis not present

## 2018-07-18 DIAGNOSIS — Z888 Allergy status to other drugs, medicaments and biological substances status: Secondary | ICD-10-CM | POA: Diagnosis not present

## 2018-07-18 DIAGNOSIS — Z885 Allergy status to narcotic agent status: Secondary | ICD-10-CM | POA: Diagnosis not present

## 2018-07-18 DIAGNOSIS — I10 Essential (primary) hypertension: Secondary | ICD-10-CM | POA: Diagnosis not present

## 2018-07-18 DIAGNOSIS — Z79899 Other long term (current) drug therapy: Secondary | ICD-10-CM | POA: Diagnosis not present

## 2018-07-19 DIAGNOSIS — F5119 Other hypersomnia not due to a substance or known physiological condition: Secondary | ICD-10-CM | POA: Diagnosis not present

## 2018-07-19 DIAGNOSIS — G5621 Lesion of ulnar nerve, right upper limb: Secondary | ICD-10-CM | POA: Diagnosis not present

## 2018-07-19 DIAGNOSIS — G5603 Carpal tunnel syndrome, bilateral upper limbs: Secondary | ICD-10-CM | POA: Diagnosis not present

## 2018-07-19 DIAGNOSIS — S60222A Contusion of left hand, initial encounter: Secondary | ICD-10-CM | POA: Diagnosis not present

## 2018-07-20 DIAGNOSIS — G5601 Carpal tunnel syndrome, right upper limb: Secondary | ICD-10-CM | POA: Diagnosis not present

## 2018-07-20 DIAGNOSIS — M545 Low back pain: Secondary | ICD-10-CM | POA: Diagnosis not present

## 2018-07-20 DIAGNOSIS — M542 Cervicalgia: Secondary | ICD-10-CM | POA: Diagnosis not present

## 2018-07-20 DIAGNOSIS — M25552 Pain in left hip: Secondary | ICD-10-CM | POA: Diagnosis not present

## 2018-07-23 DIAGNOSIS — R404 Transient alteration of awareness: Secondary | ICD-10-CM | POA: Diagnosis not present

## 2018-07-23 DIAGNOSIS — Z209 Contact with and (suspected) exposure to unspecified communicable disease: Secondary | ICD-10-CM | POA: Diagnosis not present

## 2018-08-14 DIAGNOSIS — 419620001 Death: Secondary | SNOMED CT | POA: Diagnosis not present

## 2018-08-14 DEATH — deceased

## 2018-08-18 ENCOUNTER — Other Ambulatory Visit: Payer: BLUE CROSS/BLUE SHIELD

## 2018-08-18 ENCOUNTER — Ambulatory Visit: Payer: BLUE CROSS/BLUE SHIELD | Admitting: Cardiothoracic Surgery

## 2020-01-09 IMAGING — CT CT L SPINE W/O CM
3 series · 12 of 33 positions shown, 14 images · non-contrast
Comparison: 01/09/2010 CT abdomen and pelvis.

CLINICAL DATA: 57 y/o  F; left hip pain radiating to the left foot.

EXAM:
CT LUMBAR SPINE WITHOUT CONTRAST
TECHNIQUE: Multidetector CT imaging of the lumbar spine was performed without
intravenous contrast administration. Multiplanar CT image
reconstructions were also generated.

[Series 4: l spine soft · axial · 0.29mm/px · z∈[+884,+1056]mm · 4 of 111 slices shown, 5 images]
[im 17/111  soft-tissue]
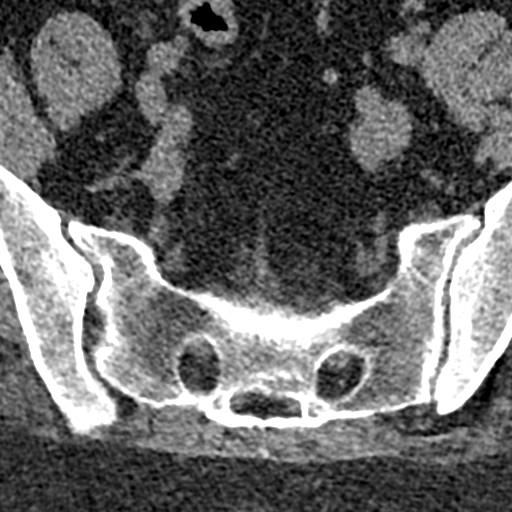
[im 17/111  bone]
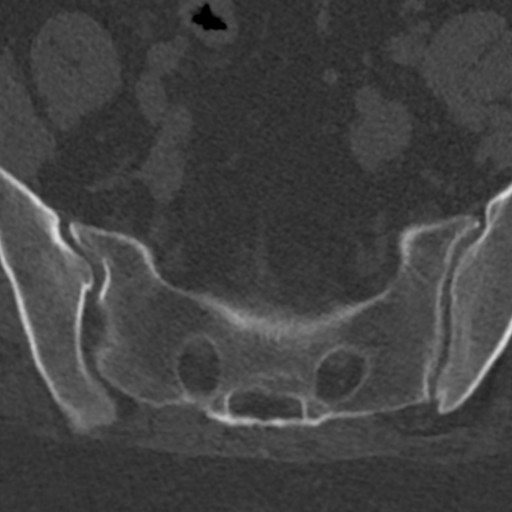
[im 43/111  bone]
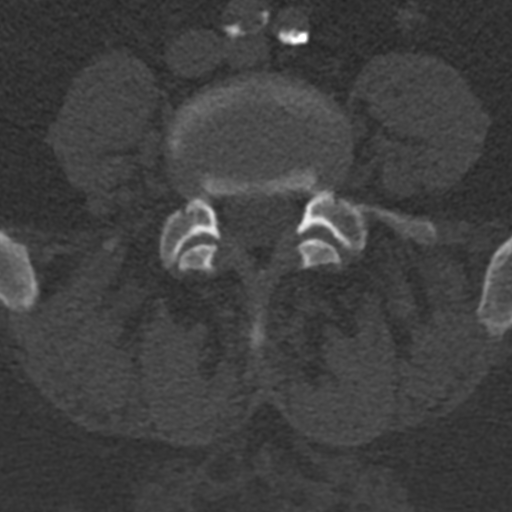
[im 68/111  bone]
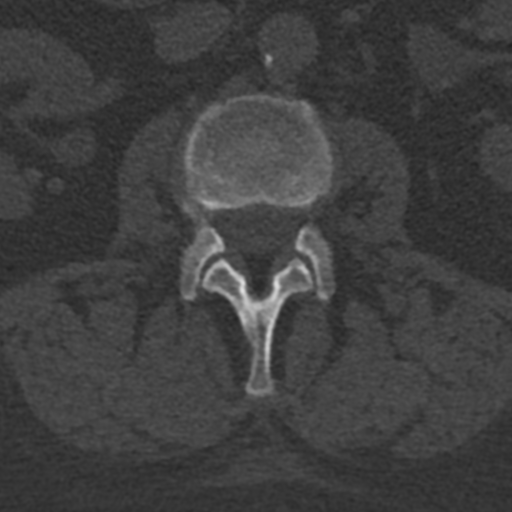
[im 94/111  bone]
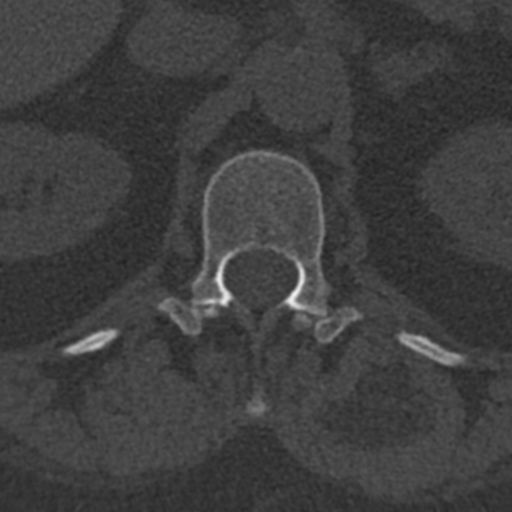

[Series 5: sagittal bone · sagittal · 0.28mm/px · 5 of 88 slices shown, 6 images]
[im 30/88  bone]
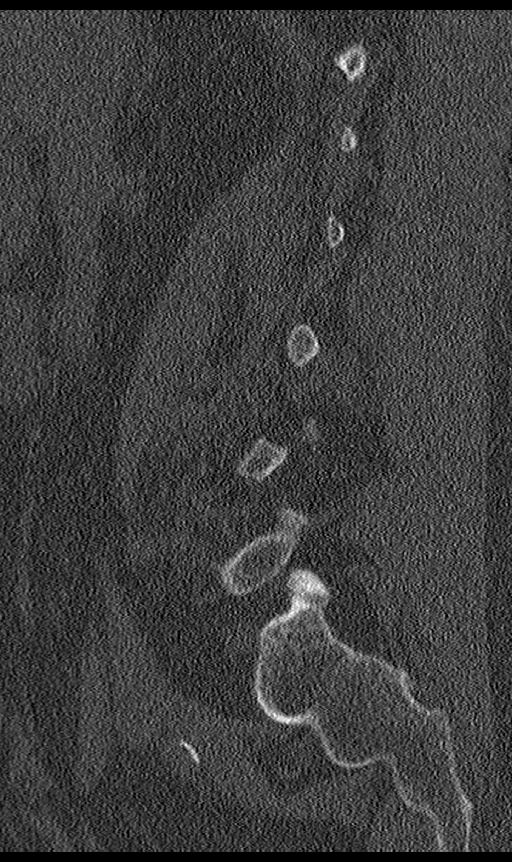
[im 37/88  bone]
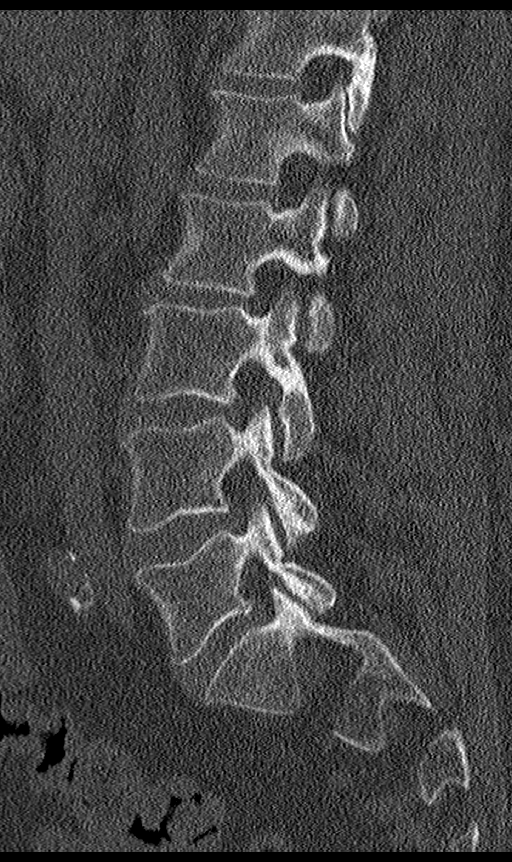
[im 44/88  soft-tissue]
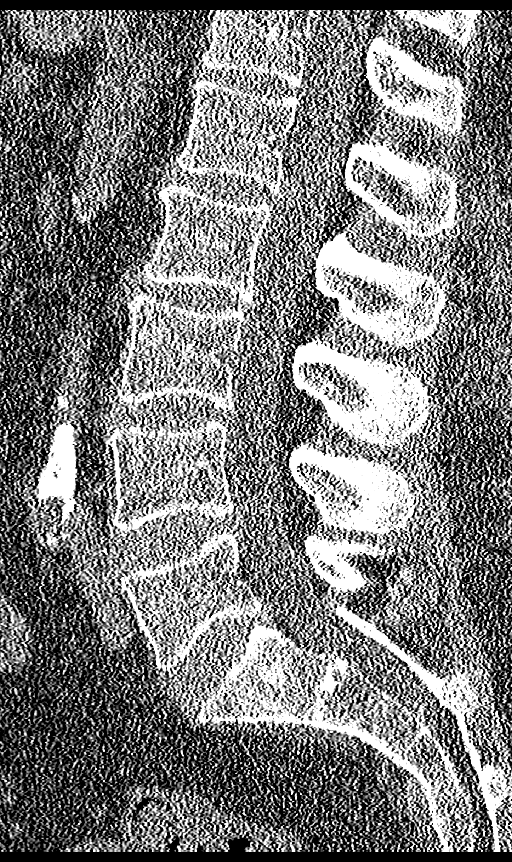
[im 44/88  bone]
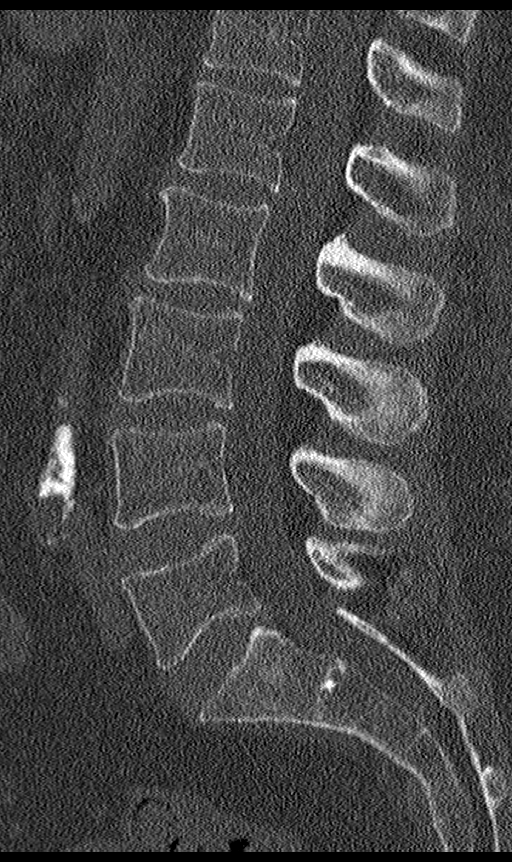
[im 51/88  bone]
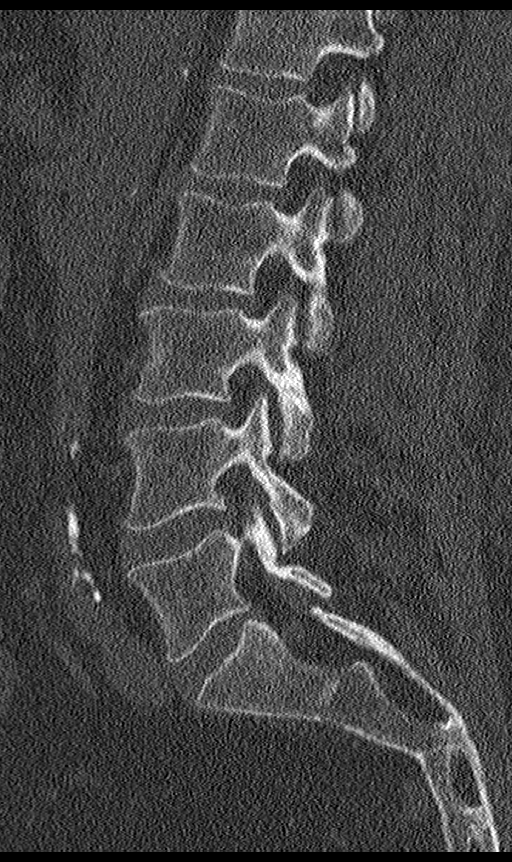
[im 59/88  bone]
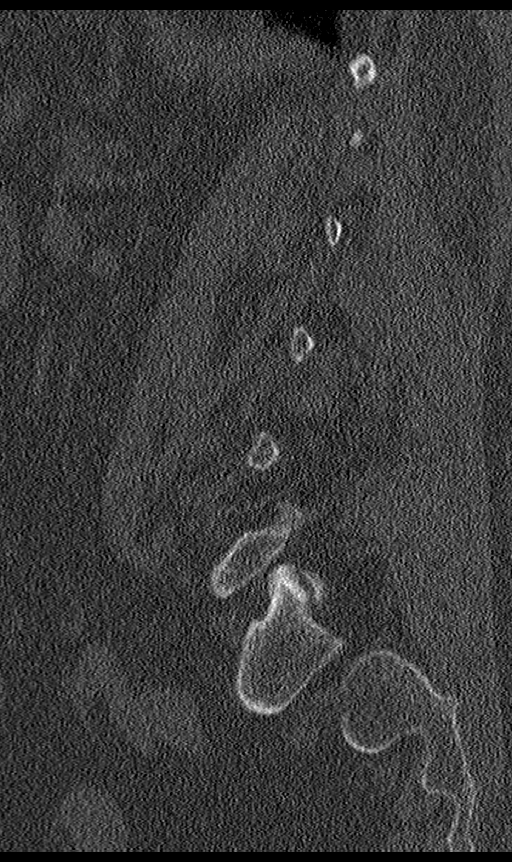

[Series 6: coronal bone · coronal · 0.36mm/px · 3 of 95 slices shown]
[im 19/95  bone]
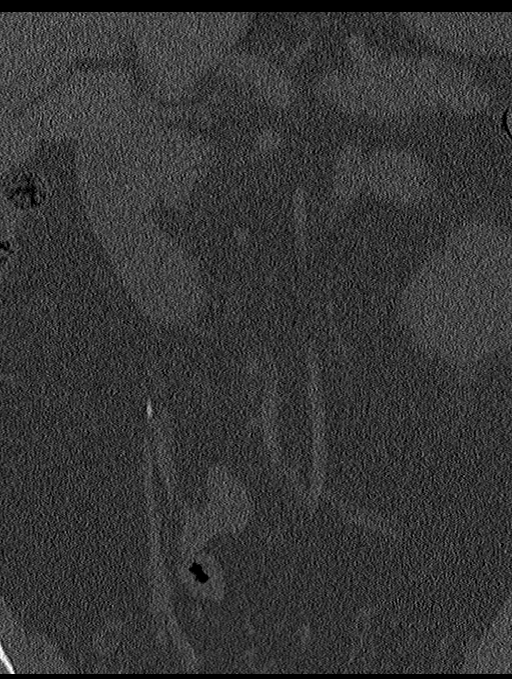
[im 38/95  bone]
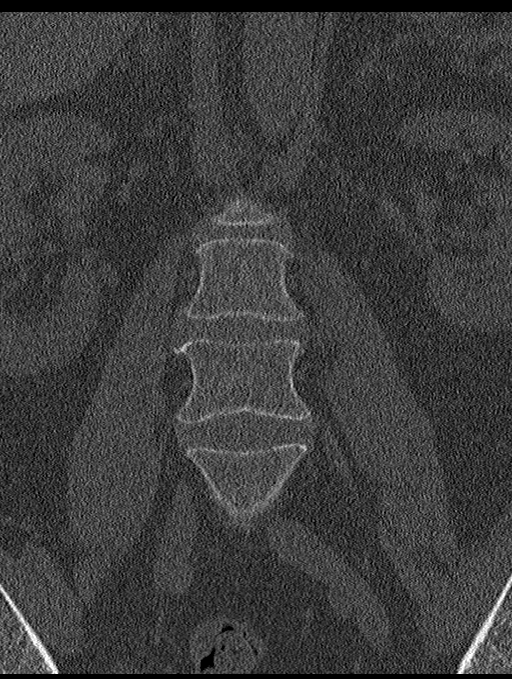
[im 57/95  bone]
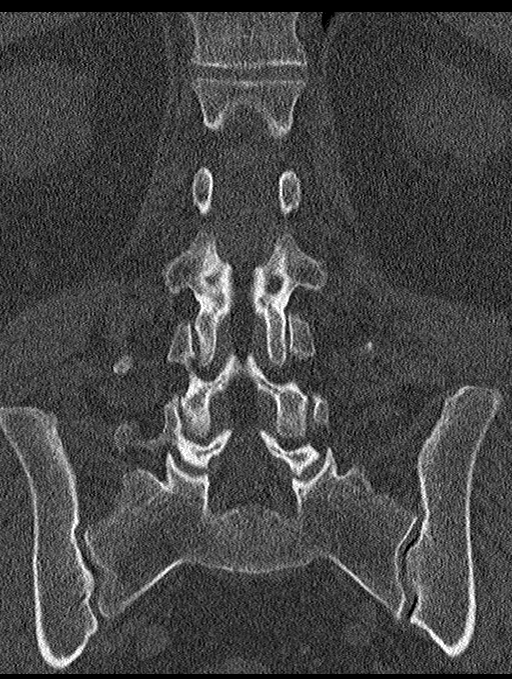

[12 of 33 positions shown; findings below may reference images not displayed]

FINDINGS: Segmentation: 5 lumbar type vertebrae.

Alignment: Normal.

Vertebrae: No acute fracture or focal pathologic process.

Paraspinal and other soft tissues: Calcific aortic atherosclerosis.

Disc levels: Vertebral body and disc space heights are preserved.
L1-2 small central disc protrusion. No significant foraminal or
canal stenosis.
IMPRESSION: 1. No acute fracture or malalignment.
2. L1-2 small central disc protrusion.
3. No significant foraminal or canal stenosis. Vertebral body and
disc space heights are preserved.

By: Tabi Bribiesca M.D.

## 2020-01-09 IMAGING — DX DG HIP (WITH OR WITHOUT PELVIS) 2-3V*L*
3 series · 3 of 3 positions shown · non-contrast
Comparison: None.

CLINICAL DATA: 57-year-old female with left lower extremity pain.

EXAM:
DG HIP (WITH OR WITHOUT PELVIS) 2-3V LEFT

[hip ap]
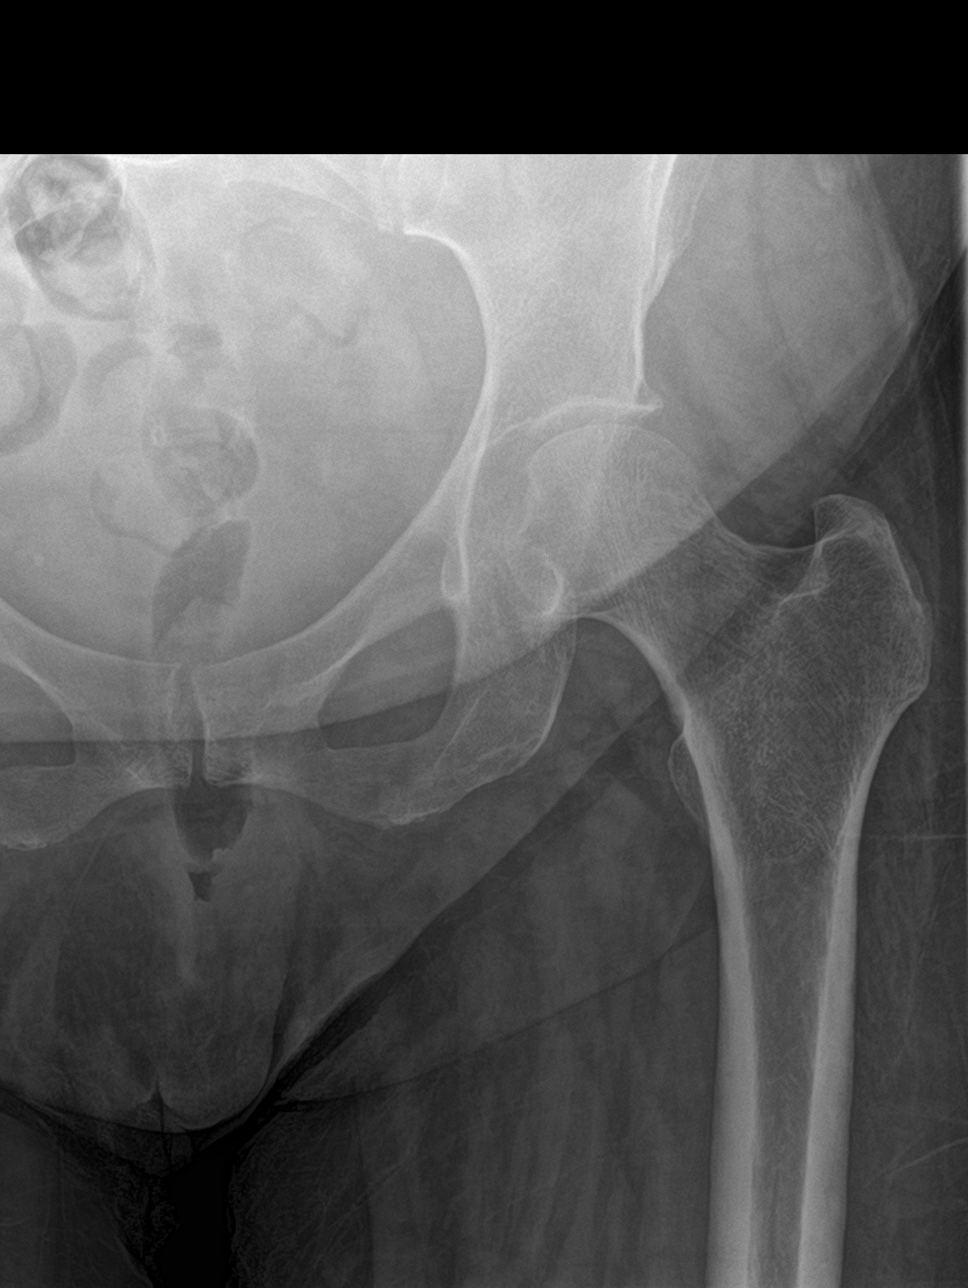

[hip lat]
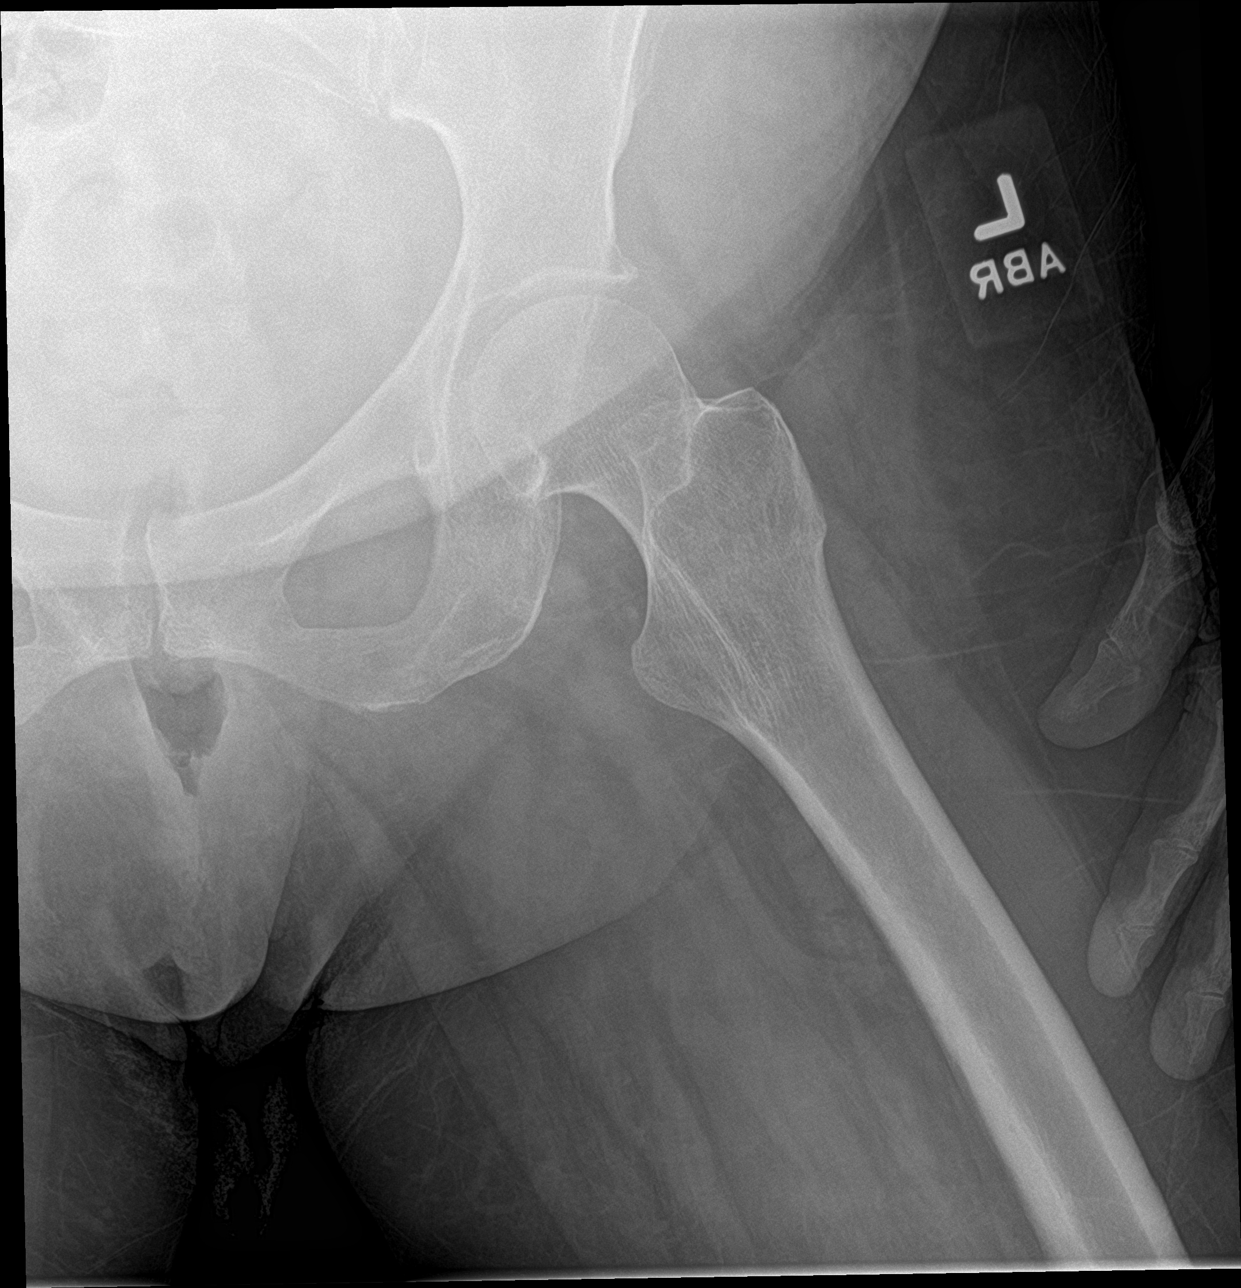

[pelvis ap]
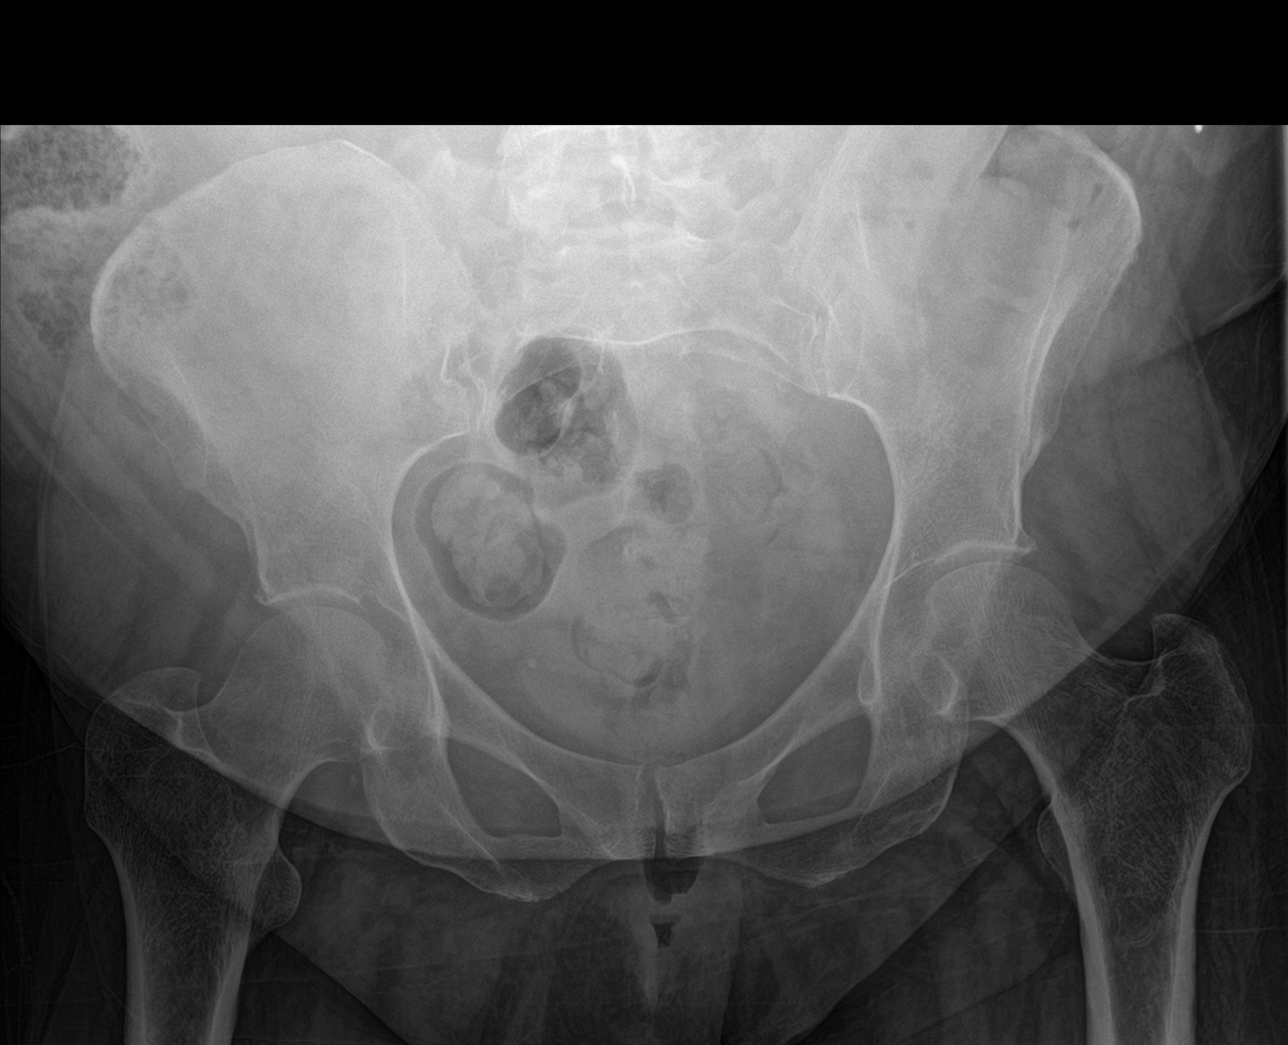

[3 of 3 positions shown; findings below may reference images not displayed]

FINDINGS: There is no acute fracture or dislocation. The bones are osteopenic.
No significant arthritic changes. The soft tissues appear
unremarkable.
IMPRESSION: Negative.
